# Patient Record
Sex: Female | Born: 1985 | Race: Black or African American | Hispanic: No | Marital: Single | Smoking: Former smoker
Health system: Southern US, Community
[De-identification: ages and names within clinical notes are randomized; demographics above are authoritative.]

## PROBLEM LIST (undated history)

## (undated) DIAGNOSIS — J45909 Unspecified asthma, uncomplicated: Secondary | ICD-10-CM

## (undated) DIAGNOSIS — F259 Schizoaffective disorder, unspecified: Secondary | ICD-10-CM

## (undated) DIAGNOSIS — N83209 Unspecified ovarian cyst, unspecified side: Secondary | ICD-10-CM

## (undated) DIAGNOSIS — F32A Depression, unspecified: Secondary | ICD-10-CM

## (undated) DIAGNOSIS — I10 Essential (primary) hypertension: Secondary | ICD-10-CM

## (undated) HISTORY — PX: OVARIAN CYST SURGERY: SHX726

---

## 2013-09-16 ENCOUNTER — Emergency Department (HOSPITAL_BASED_OUTPATIENT_CLINIC_OR_DEPARTMENT_OTHER)
Admission: EM | Admit: 2013-09-16 | Discharge: 2013-09-16 | Disposition: A | Discharge status: Home / Self Care | Attending: Emergency Medicine | Admitting: Emergency Medicine

## 2013-09-16 ENCOUNTER — Encounter (HOSPITAL_BASED_OUTPATIENT_CLINIC_OR_DEPARTMENT_OTHER): Payer: Self-pay | Admitting: Emergency Medicine

## 2013-09-16 DIAGNOSIS — R197 Diarrhea, unspecified: Secondary | ICD-10-CM

## 2013-09-16 DIAGNOSIS — Z3202 Encounter for pregnancy test, result negative: Secondary | ICD-10-CM | POA: Insufficient documentation

## 2013-09-16 DIAGNOSIS — Z79899 Other long term (current) drug therapy: Secondary | ICD-10-CM | POA: Insufficient documentation

## 2013-09-16 LAB — CBC WITH DIFFERENTIAL/PLATELET
Basophils Absolute: 0 10*3/uL (ref 0.0–0.1)
Basophils Relative: 0 % (ref 0–1)
EOS ABS: 0.2 10*3/uL (ref 0.0–0.7)
Eosinophils Relative: 4 % (ref 0–5)
HCT: 36 % (ref 36.0–46.0)
Hemoglobin: 11.6 g/dL — ABNORMAL LOW (ref 12.0–15.0)
Lymphocytes Relative: 30 % (ref 12–46)
Lymphs Abs: 1.4 10*3/uL (ref 0.7–4.0)
MCH: 26.2 pg (ref 26.0–34.0)
MCHC: 32.2 g/dL (ref 30.0–36.0)
MCV: 81.3 fL (ref 78.0–100.0)
Monocytes Absolute: 0.5 10*3/uL (ref 0.1–1.0)
Monocytes Relative: 11 % (ref 3–12)
NEUTROS ABS: 2.7 10*3/uL (ref 1.7–7.7)
NEUTROS PCT: 56 % (ref 43–77)
PLATELETS: 314 10*3/uL (ref 150–400)
RBC: 4.43 MIL/uL (ref 3.87–5.11)
RDW: 14.9 % (ref 11.5–15.5)
WBC: 4.8 10*3/uL (ref 4.0–10.5)

## 2013-09-16 LAB — COMPREHENSIVE METABOLIC PANEL
ALK PHOS: 78 U/L (ref 39–117)
ALT: 14 U/L (ref 0–35)
AST: 20 U/L (ref 0–37)
Albumin: 3.8 g/dL (ref 3.5–5.2)
Anion gap: 11 (ref 5–15)
BUN: 9 mg/dL (ref 6–23)
CHLORIDE: 103 meq/L (ref 96–112)
CO2: 27 meq/L (ref 19–32)
Calcium: 9.8 mg/dL (ref 8.4–10.5)
Creatinine, Ser: 0.9 mg/dL (ref 0.50–1.10)
GFR calc non Af Amer: 87 mL/min — ABNORMAL LOW (ref >90)
GLUCOSE: 92 mg/dL (ref 70–99)
POTASSIUM: 3.9 meq/L (ref 3.7–5.3)
SODIUM: 141 meq/L (ref 137–147)
TOTAL PROTEIN: 8 g/dL (ref 6.0–8.3)
Total Bilirubin: 0.5 mg/dL (ref 0.3–1.2)

## 2013-09-16 LAB — URINALYSIS, ROUTINE W REFLEX MICROSCOPIC
Bilirubin Urine: NEGATIVE
GLUCOSE, UA: NEGATIVE mg/dL
Hgb urine dipstick: NEGATIVE
Ketones, ur: 15 mg/dL — AB
NITRITE: NEGATIVE
Protein, ur: NEGATIVE mg/dL
SPECIFIC GRAVITY, URINE: 1.034 — AB (ref 1.005–1.030)
Urobilinogen, UA: 0.2 mg/dL (ref 0.0–1.0)
pH: 6 (ref 5.0–8.0)

## 2013-09-16 LAB — PREGNANCY, URINE: PREG TEST UR: NEGATIVE

## 2013-09-16 LAB — URINE MICROSCOPIC-ADD ON

## 2013-09-16 MED ORDER — KETOROLAC TROMETHAMINE 30 MG/ML IJ SOLN
30.0000 mg | Freq: Once | INTRAMUSCULAR | Status: AC
  Administered 2013-09-16: 30 mg via INTRAVENOUS
  Filled 2013-09-16: qty 1

## 2013-09-16 MED ORDER — SODIUM CHLORIDE 0.9 % IV BOLUS (SEPSIS)
1000.0000 mL | Freq: Once | INTRAVENOUS | Status: AC
  Administered 2013-09-16: 1000 mL via INTRAVENOUS

## 2013-09-16 NOTE — ED Provider Notes (Signed)
CSN: [id number]     Arrival date & time 09/16/13  0535 History   None    Chief Complaint  Patient presents with  . Diarrhea     (Consider location/radiation/quality/duration/timing/severity/associated sxs/prior Treatment) HPI Comments: Patient is a 28 year old female with no significant past medical history. She presents with complaints of diarrhea since yesterday morning. She reports multiple episodes of watery stools. There is no blood or fever. She reports some abdominal cramping but denies significant pain. She denies any ill contacts and denies having eaten any suspicious foods which may have made her sick.  Patient is a 29 y.o. female presenting with diarrhea. The history is provided by the patient.  Diarrhea Quality:  Watery Severity:  Moderate Onset quality:  Sudden Duration:  1 day Timing:  Constant Progression:  Worsening Relieved by:  Nothing Worsened by:  Nothing tried Ineffective treatments:  None tried Associated symptoms: no abdominal pain, no chills, no fever and no vomiting     History reviewed. No pertinent past medical history. History reviewed. No pertinent past surgical history. History reviewed. No pertinent family history. History  Substance Use Topics  . Smoking status: Never Smoker   . Smokeless tobacco: Not on file  . Alcohol Use: No     Comment: occasional   OB History   Grav Para Term Preterm Abortions TAB SAB Ect Mult Living                 Review of Systems  Constitutional: Negative for fever and chills.  Gastrointestinal: Positive for diarrhea. Negative for vomiting and abdominal pain.  All other systems reviewed and are negative.     Allergies  Review of patient's allergies indicates no known allergies.  Home Medications   Prior to Admission medications   Medication Sig Start Date End Date Taking? Authorizing Provider  prenatal vitamin w/FE, FA (PRENATAL 1 + 1) 27-1 MG TABS tablet Take 1 tablet by mouth daily at 12 noon.   Yes  Historical Provider, MD   BP 128/81  Pulse 84  Temp(Src) 98.4 F (36.9 C) (Oral)  Resp 18  Ht 5\' 9"  (1.753 m)  Wt 239 lb (108.41 kg)  BMI 35.28 kg/m2  SpO2 100%  LMP 08/12/2013 Physical Exam  Nursing note and vitals reviewed. Constitutional: She is oriented to person, place, and time. She appears well-developed and well-nourished. No distress.  HENT:  Head: Normocephalic and atraumatic.  Mouth/Throat: Oropharynx is clear and moist.  Neck: Normal range of motion. Neck supple.  Cardiovascular: Normal rate and regular rhythm.  Exam reveals no gallop and no friction rub.   No murmur heard. Pulmonary/Chest: Effort normal and breath sounds normal. No respiratory distress. She has no wheezes.  Abdominal: Soft. Bowel sounds are normal. She exhibits no distension. There is no tenderness.  Musculoskeletal: Normal range of motion. She exhibits no edema.  Neurological: She is alert and oriented to person, place, and time.  Skin: Skin is warm and dry. She is not diaphoretic.    ED Course  Procedures (including critical care time) Labs Review Labs Reviewed  URINALYSIS, ROUTINE W REFLEX MICROSCOPIC  PREGNANCY, URINE  CBC WITH DIFFERENTIAL  COMPREHENSIVE METABOLIC PANEL    Imaging Review No results found.   EKG Interpretation None      MDM   Final diagnoses:  None    Patient presents with a 24-hour history of diarrhea. This has all been nonbloody and her abdomen is benign. There is no elevation of white count and no electrolyte abnormalities that  would suggest dehydration. I suspect this is a viral illness, or perhaps less likely a food born illness. Her urinalysis is clear and pregnancy test is negative. She was given IV fluids and Toradol and is feeling better. She will be discharged to home with instructions to return as needed if she develops severe pain, bloody stool, or other new and concerning symptoms.   Geoffery Lyonsouglas Ariday Brinker, MD 09/16/13 [phone]

## 2013-09-16 NOTE — ED Notes (Signed)
Pt reports loose watery stools since 10 pm, lower abdominal pain since last pm, denies sick contacts, denies any recent antibiotics

## 2013-09-16 NOTE — Discharge Instructions (Signed)
Clear liquids as tolerated for the next 12 hours, then slowly advance your diet to normal.  Return to the emergency department if he develops severe abdominal pain, high fever, bloody stool, or other new and concerning symptoms.   Diarrhea Diarrhea is frequent loose and watery bowel movements. It can cause you to feel weak and dehydrated. Dehydration can cause you to become tired and thirsty, have a dry mouth, and have decreased urination that often is dark yellow. Diarrhea is a sign of another problem, most often an infection that will not last long. In most cases, diarrhea typically lasts 2-3 days. However, it can last longer if it is a sign of something more serious. It is important to treat your diarrhea as directed by your caregiver to lessen or prevent future episodes of diarrhea. CAUSES  Some common causes include:  Gastrointestinal infections caused by viruses, bacteria, or parasites.  Food poisoning or food allergies.  Certain medicines, such as antibiotics, chemotherapy, and laxatives.  Artificial sweeteners and fructose.  Digestive disorders. HOME CARE INSTRUCTIONS  Ensure adequate fluid intake (hydration): Have 1 cup (8 oz) of fluid for each diarrhea episode. Avoid fluids that contain simple sugars or sports drinks, fruit juices, whole milk products, and sodas. Your urine should be clear or pale yellow if you are drinking enough fluids. Hydrate with an oral rehydration solution that you can purchase at pharmacies, retail stores, and online. You can prepare an oral rehydration solution at home by mixing the following ingredients together:   - tsp table salt.   tsp baking soda.   tsp salt substitute containing potassium chloride.  1  tablespoons sugar.  1 L (34 oz) of water.  Certain foods and beverages may increase the speed at which food moves through the gastrointestinal (GI) tract. These foods and beverages should be avoided and include:  Caffeinated and alcoholic  beverages.  High-fiber foods, such as raw fruits and vegetables, nuts, seeds, and whole grain breads and cereals.  Foods and beverages sweetened with sugar alcohols, such as xylitol, sorbitol, and mannitol.  Some foods may be well tolerated and may help thicken stool including:  Starchy foods, such as rice, toast, pasta, low-sugar cereal, oatmeal, grits, baked potatoes, crackers, and bagels.  Bananas.  Applesauce.  Add probiotic-rich foods to help increase healthy bacteria in the GI tract, such as yogurt and fermented milk products.  Wash your hands well after each diarrhea episode.  Only take over-the-counter or prescription medicines as directed by your caregiver.  Take a warm bath to relieve any burning or pain from frequent diarrhea episodes. SEEK IMMEDIATE MEDICAL CARE IF:   You are unable to keep fluids down.  You have persistent vomiting.  You have blood in your stool, or your stools are black and tarry.  You do not urinate in 6-8 hours, or there is only a small amount of very dark urine.  You have abdominal pain that increases or localizes.  You have weakness, dizziness, confusion, or light-headedness.  You have a severe headache.  Your diarrhea gets worse or does not get better.  You have a fever or persistent symptoms for more than 2-3 days.  You have a fever and your symptoms suddenly get worse. MAKE SURE YOU:   Understand these instructions.  Will watch your condition.  Will get help right away if you are not doing well or get worse. Document Released: 01/24/2002 Document Revised: 06/20/2013 Document Reviewed: 10/12/2011 [facility] Patient Information 2015 [place], [place]. This information is not intended to  replace advice given to you by your health care provider. Make sure you discuss any questions you have with your health care provider. ° °

## 2014-04-22 DIAGNOSIS — R141 Gas pain: Secondary | ICD-10-CM | POA: Insufficient documentation

## 2014-04-22 DIAGNOSIS — Z720 Tobacco use: Secondary | ICD-10-CM | POA: Diagnosis not present

## 2014-04-22 DIAGNOSIS — K5909 Other constipation: Secondary | ICD-10-CM | POA: Insufficient documentation

## 2014-04-22 DIAGNOSIS — Z8742 Personal history of other diseases of the female genital tract: Secondary | ICD-10-CM | POA: Diagnosis not present

## 2014-04-22 DIAGNOSIS — Z3202 Encounter for pregnancy test, result negative: Secondary | ICD-10-CM | POA: Diagnosis not present

## 2014-04-22 DIAGNOSIS — J029 Acute pharyngitis, unspecified: Secondary | ICD-10-CM | POA: Diagnosis present

## 2014-04-22 DIAGNOSIS — J069 Acute upper respiratory infection, unspecified: Secondary | ICD-10-CM | POA: Diagnosis not present

## 2014-04-22 DIAGNOSIS — J45909 Unspecified asthma, uncomplicated: Secondary | ICD-10-CM | POA: Insufficient documentation

## 2014-04-22 NOTE — ED Notes (Addendum)
Pt reports 5 days of sinus congestion and sore throat, reports multiple sick contacts, one with strep and another with pneumonia.  Subjective fever.  Also reports missed period, last one jan 8, having abd pain and gas.  Reports attempt to get pregnant currently.  Denies n/v/d.  Reported fatigue. Throat slightly red, no tonsillar swelling or exudate noted.

## 2014-04-23 ENCOUNTER — Encounter (HOSPITAL_BASED_OUTPATIENT_CLINIC_OR_DEPARTMENT_OTHER): Payer: Self-pay

## 2014-04-23 ENCOUNTER — Emergency Department (HOSPITAL_BASED_OUTPATIENT_CLINIC_OR_DEPARTMENT_OTHER)
Admission: EM | Admit: 2014-04-23 | Discharge: 2014-04-23 | Disposition: A | Discharge status: Home / Self Care | Attending: Emergency Medicine | Admitting: Emergency Medicine

## 2014-04-23 DIAGNOSIS — IMO0001 Reserved for inherently not codable concepts without codable children: Secondary | ICD-10-CM

## 2014-04-23 DIAGNOSIS — K5909 Other constipation: Secondary | ICD-10-CM

## 2014-04-23 DIAGNOSIS — J069 Acute upper respiratory infection, unspecified: Secondary | ICD-10-CM

## 2014-04-23 HISTORY — DX: Unspecified asthma, uncomplicated: J45.909

## 2014-04-23 HISTORY — DX: Unspecified ovarian cyst, unspecified side: N83.209

## 2014-04-23 LAB — URINALYSIS, ROUTINE W REFLEX MICROSCOPIC
Bilirubin Urine: NEGATIVE
Glucose, UA: NEGATIVE mg/dL
Hgb urine dipstick: NEGATIVE
KETONES UR: NEGATIVE mg/dL
Leukocytes, UA: NEGATIVE
Nitrite: NEGATIVE
PH: 7 (ref 5.0–8.0)
PROTEIN: NEGATIVE mg/dL
SPECIFIC GRAVITY, URINE: 1.011 (ref 1.005–1.030)
UROBILINOGEN UA: 0.2 mg/dL (ref 0.0–1.0)

## 2014-04-23 LAB — PREGNANCY, URINE: PREG TEST UR: NEGATIVE

## 2014-04-23 MED ORDER — IBUPROFEN 400 MG PO TABS: 400.0000 mg | ORAL_TABLET | Freq: Four times a day (QID) | ORAL | Status: DC | PRN

## 2014-04-23 MED ORDER — IBUPROFEN 800 MG PO TABS
800.0000 mg | ORAL_TABLET | Freq: Once | ORAL | Status: AC
  Administered 2014-04-23: 800 mg via ORAL
  Filled 2014-04-23: qty 1

## 2014-04-23 NOTE — Discharge Instructions (Signed)
°Bloating °Bloating is the feeling of fullness in your belly. You may feel as though your pants are too tight. Often the cause of bloating is overeating, retaining fluids, or having gas in your bowel. It is also caused by swallowing air and eating foods that cause gas. Irritable bowel syndrome is one of the most common causes of bloating. Constipation is also a common cause. Sometimes more serious problems can cause bloating. °SYMPTOMS  °Usually there is a feeling of fullness, as though your abdomen is bulged out. There may be mild discomfort.  °DIAGNOSIS  °Usually no particular testing is necessary for most bloating. If the condition persists and seems to become worse, your caregiver may do additional testing.  °TREATMENT  °· There is no direct treatment for bloating. °· Do not put gas into the bowel. Avoid chewing gum and sucking on candy. These tend to make you swallow air. Swallowing air can also be a nervous habit. Try to avoid this. °· Avoiding high residue diets will help. Eat foods with soluble fibers (examples include root vegetables, apples, or barley) and substitute dairy products with soy and rice products. This helps irritable bowel syndrome. °· If constipation is the cause, then a high residue diet with more fiber will help. °· Avoid carbonated beverages. °· Over-the-counter preparations are available that help reduce gas. Your pharmacist can help you with this. °SEEK MEDICAL CARE IF:  °· Bloating continues and seems to be getting worse. °· You notice a weight gain. °· You have a weight loss but the bloating is getting worse. °· You have changes in your bowel habits or develop nausea or vomiting. °SEEK IMMEDIATE MEDICAL CARE IF:  °· You develop shortness of breath or swelling in your legs. °· You have an increase in abdominal pain or develop chest pain. °Document Released: 12/04/2005 Document Revised: 04/28/2011 Document Reviewed: 01/22/2007 °ExitCare® Patient Information ©2015 [employer]. This  information is not intended to replace advice given to you by your health care provider. Make sure you discuss any questions you have with your health care provider. ° °Constipation °Constipation is when a person: °· Poops (has a bowel movement) less than 3 times a week. °· Has a hard time pooping. °· Has poop that is dry, hard, or bigger than normal. °HOME CARE  °· Eat foods with a lot of fiber in them. This includes fruits, vegetables, beans, and whole grains such as brown rice. °· Avoid fatty foods and foods with a lot of sugar. This includes french fries, hamburgers, cookies, candy, and soda. °· If you are not getting enough fiber from food, take products with added fiber in them (supplements). °· Drink enough fluid to keep your pee (urine) clear or pale yellow. °· Exercise on a regular basis, or as told by your doctor. °· Go to the restroom when you feel like you need to poop. Do not hold it. °· Only take medicine as told by your doctor. Do not take medicines that help you poop (laxatives) without talking to your doctor first. °GET HELP RIGHT AWAY IF:  °· You have bright red blood in your poop (stool). °· Your constipation lasts more than 4 days or gets worse. °· You have belly (abdominal) or butt (rectal) pain. °· You have thin poop (as thin as a pencil). °· You lose weight, and it cannot be explained. °MAKE SURE YOU:  °· Understand these instructions. °· Will watch your condition. °· Will get help right away if you are not doing well or   get worse. Document Released: 07/23/2007 Document Revised: 02/08/2013 Document Reviewed: 11/15/2012 [facility] Patient Information 2015 [place]Care, [place]. This information is not intended to replace advice given to you by your health care provider. Make sure you discuss any questions you have with your health care provider.  Cool Mist Vaporizers Vaporizers may help relieve the symptoms of a cough and cold. They add moisture to the air, which helps mucus to become thinner and  less sticky. This makes it easier to breathe and cough up secretions. Cool mist vaporizers do not cause serious burns like hot mist vaporizers, which may also be called steamers or humidifiers. Vaporizers have not been proven to help with colds. You should not use a vaporizer if you are allergic to mold. HOME CARE INSTRUCTIONS  Follow the package instructions for the vaporizer.  Do not use anything other than distilled water in the vaporizer.  Do not run the vaporizer all of the time. This can cause mold or bacteria to grow in the vaporizer.  Clean the vaporizer after each time it is used.  Clean and dry the vaporizer well before storing it.  Stop using the vaporizer if worsening respiratory symptoms develop. Document Released: 11/01/2003 Document Revised: 02/08/2013 Document Reviewed: 06/23/2012 [facility] Patient Information 2015 [place] CanyonExitCare, [place]LLC. This information is not intended to replace advice given to you by your health care provider. Make sure you discuss any questions you have with your health care provider.

## 2014-04-23 NOTE — ED Provider Notes (Signed)
CSN: [id number]     Arrival date & time 04/22/14  2336 History   First MD Initiated Contact with Patient 04/23/14 0203     Chief Complaint  Patient presents with  . Sore Throat     (Consider location/radiation/quality/duration/timing/severity/associated sxs/prior Treatment) Patient is a 29 y.o. female presenting with pharyngitis. The history is provided by the patient.  Sore Throat This is a new problem. The current episode started more than 2 days ago. The problem occurs constantly. The problem has not changed since onset.Pertinent negatives include no chest pain, no headaches and no shortness of breath. Associated symptoms comments: Gas pressure in abdomen and missed period. Nothing aggravates the symptoms. Nothing relieves the symptoms. She has tried nothing for the symptoms. The treatment provided no relief.    Past Medical History  Diagnosis Date  . Asthma   . Ovarian cyst    Past Surgical History  Procedure Laterality Date  . Ovarian cyst surgery    . Cesarean section     No family history on file. History  Substance Use Topics  . Smoking status: Light Tobacco Smoker    Types: Cigarettes  . Smokeless tobacco: Not on file  . Alcohol Use: No     Comment: occasional   OB History    No data available     Review of Systems  Constitutional: Negative for fever.  HENT: Positive for congestion and sore throat. Negative for trouble swallowing and voice change.   Respiratory: Negative for shortness of breath.   Cardiovascular: Negative for chest pain.  Gastrointestinal: Negative for vomiting.  Neurological: Negative for headaches.  All other systems reviewed and are negative.     Allergies  Review of patient's allergies indicates no known allergies.  Home Medications   Prior to Admission medications   Medication Sig Start Date End Date Taking? Authorizing Provider  prenatal vitamin w/FE, FA (PRENATAL 1 + 1) 27-1 MG TABS tablet Take 1 tablet by mouth daily at 12  noon.    Historical Provider, MD   BP 141/85 mmHg  Pulse 94  Temp(Src) 98.2 F (36.8 C) (Oral)  Resp 18  Ht 5\' 9"  (1.753 m)  Wt 250 lb (113.399 kg)  BMI 36.90 kg/m2  SpO2 100%  LMP 02/24/2014 Physical Exam  Constitutional: She is oriented to person, place, and time. She appears well-developed and well-nourished. No distress.  HENT:  Head: Normocephalic and atraumatic.  Mouth/Throat: Oropharynx is clear and moist. No oropharyngeal exudate.  Eyes: Conjunctivae are normal. Pupils are equal, round, and reactive to light.  Neck: Normal range of motion. Neck supple. No tracheal deviation present.  Intact phonation no pain with displacement of the trachea  Cardiovascular: Normal rate and intact distal pulses.   Pulmonary/Chest: Effort normal and breath sounds normal. She has no wheezes. She has no rales.  Abdominal: Soft. Bowel sounds are normal. She exhibits no mass. There is no tenderness. There is no rebound and no guarding.  Hyperactive BS and palpable stool throughout  Musculoskeletal: Normal range of motion.  Lymphadenopathy:    She has no cervical adenopathy.  Neurological: She is alert and oriented to person, place, and time.  Skin: Skin is warm and dry.  Psychiatric: She has a normal mood and affect.    ED Course  Procedures (including critical care time) Labs Review Labs Reviewed  URINALYSIS, ROUTINE W REFLEX MICROSCOPIC  PREGNANCY, URINE    Imaging Review No results found.   EKG Interpretation None      MDM  Final diagnoses:  None   Based on [facility] criteria there is no need for further testing or treatment  URI and gas and constipation.  Was here mainly because she thought she is pregnant having missed a period.  But she has not.  Will treat with miralax gas x and ibuprofen    Deondrea Markos K Chimamanda Siegfried-Rasch, MD 04/23/14 [phone]

## 2014-12-13 ENCOUNTER — Encounter (HOSPITAL_BASED_OUTPATIENT_CLINIC_OR_DEPARTMENT_OTHER): Payer: Self-pay

## 2014-12-13 ENCOUNTER — Emergency Department (HOSPITAL_BASED_OUTPATIENT_CLINIC_OR_DEPARTMENT_OTHER)
Admission: EM | Admit: 2014-12-13 | Discharge: 2014-12-13 | Disposition: A | Discharge status: Home / Self Care | Attending: Emergency Medicine | Admitting: Emergency Medicine

## 2014-12-13 DIAGNOSIS — Z87891 Personal history of nicotine dependence: Secondary | ICD-10-CM | POA: Diagnosis not present

## 2014-12-13 DIAGNOSIS — Z8742 Personal history of other diseases of the female genital tract: Secondary | ICD-10-CM | POA: Diagnosis not present

## 2014-12-13 DIAGNOSIS — Z3202 Encounter for pregnancy test, result negative: Secondary | ICD-10-CM | POA: Diagnosis not present

## 2014-12-13 DIAGNOSIS — J020 Streptococcal pharyngitis: Secondary | ICD-10-CM | POA: Diagnosis not present

## 2014-12-13 DIAGNOSIS — J45909 Unspecified asthma, uncomplicated: Secondary | ICD-10-CM | POA: Diagnosis not present

## 2014-12-13 DIAGNOSIS — R509 Fever, unspecified: Secondary | ICD-10-CM | POA: Diagnosis present

## 2014-12-13 LAB — URINALYSIS, ROUTINE W REFLEX MICROSCOPIC
Bilirubin Urine: NEGATIVE
Glucose, UA: NEGATIVE mg/dL
HGB URINE DIPSTICK: NEGATIVE
Ketones, ur: NEGATIVE mg/dL
Leukocytes, UA: NEGATIVE
Nitrite: NEGATIVE
PH: 7 (ref 5.0–8.0)
Protein, ur: NEGATIVE mg/dL
SPECIFIC GRAVITY, URINE: 1.007 (ref 1.005–1.030)
UROBILINOGEN UA: 1 mg/dL (ref 0.0–1.0)

## 2014-12-13 LAB — PREGNANCY, URINE: PREG TEST UR: NEGATIVE

## 2014-12-13 LAB — RAPID STREP SCREEN (MED CTR MEBANE ONLY): Streptococcus, Group A Screen (Direct): POSITIVE — AB

## 2014-12-13 MED ORDER — IBUPROFEN 100 MG/5ML PO SUSP: 10.0000 mg/kg | Freq: Four times a day (QID) | ORAL | Status: DC | PRN

## 2014-12-13 MED ORDER — ACETAMINOPHEN 160 MG/5ML PO ELIX: 15.0000 mg/kg | ORAL_SOLUTION | Freq: Four times a day (QID) | ORAL | Status: DC | PRN

## 2014-12-13 MED ORDER — ACETAMINOPHEN 325 MG PO TABS
650.0000 mg | ORAL_TABLET | Freq: Once | ORAL | Status: AC
  Administered 2014-12-13: 650 mg via ORAL
  Filled 2014-12-13: qty 2

## 2014-12-13 MED ORDER — PENICILLIN G BENZATHINE 1200000 UNIT/2ML IM SUSP
1.2000 10*6.[IU] | Freq: Once | INTRAMUSCULAR | Status: AC
  Administered 2014-12-13: 1.2 10*6.[IU] via INTRAMUSCULAR
  Filled 2014-12-13: qty 2

## 2014-12-13 MED ORDER — KETOROLAC TROMETHAMINE 60 MG/2ML IM SOLN
60.0000 mg | Freq: Once | INTRAMUSCULAR | Status: AC
  Administered 2014-12-13: 60 mg via INTRAMUSCULAR
  Filled 2014-12-13: qty 2

## 2014-12-13 NOTE — ED Provider Notes (Signed)
CSN: [id number]     Arrival date & time 12/13/14  1808 History   First MD Initiated Contact with Patient 12/13/14 1824     Chief Complaint  Patient presents with  . Fever     (Consider location/radiation/quality/duration/timing/severity/associated sxs/prior Treatment) HPI Patient developed a sore throat 5 days ago. She reports it is painful to swallow and her breath has been really bad. She states she's had just a little bit of nasal discharge. No cough. She for she has asthma but doesn't feel like that has been acting up. She reports she's had some nausea but no active vomiting. Poor she's had a fever which got up to 102. She reports she's betting getting some chills for she is able to swallow her saliva and liquids. Past Medical History  Diagnosis Date  . Asthma   . Ovarian cyst    Past Surgical History  Procedure Laterality Date  . Ovarian cyst surgery    . Cesarean section     No family history on file. Social History  Substance Use Topics  . Smoking status: Former Smoker    Types: Cigarettes  . Smokeless tobacco: None  . Alcohol Use: Yes     Comment: occ   OB History    No data available     Review of Systems 10 Systems reviewed and are negative for acute change except as noted in the HPI.    Allergies  Review of patient's allergies indicates no known allergies.  Home Medications   Prior to Admission medications   Medication Sig Start Date End Date Taking? Authorizing Provider  acetaminophen (TYLENOL) 160 MG/5ML elixir Take 31 mLs (992 mg total) by mouth every 6 (six) hours as needed for fever or pain. 12/13/14   Arby Barrette, MD  ibuprofen (CHILD IBUPROFEN) 100 MG/5ML suspension Take 33.1 mLs (662 mg total) by mouth every 6 (six) hours as needed. 12/13/14   Arby Barrette, MD   BP 156/98 mmHg  Pulse 102  Temp(Src) 102.9 F (39.4 C) (Oral)  Resp 16  Ht  (1.753 m)  Wt 250 lb (113.399 kg)  BMI 36.90 kg/m2  SpO2 97%  LMP 10/29/2014 Physical Exam   Constitutional: She is oriented to person, place, and time. She appears well-developed and well-nourished.  HENT:  Head: Normocephalic and atraumatic.  Vital TMs are without erythema or bulging. Posterior oropharynx has thick white exudate on both tonsils. Oropharynx is widely patent. No difficulty handling handling secretions.  Eyes: EOM are normal. Pupils are equal, round, and reactive to light.  Neck: Neck supple.  Cardiovascular: Normal rate, regular rhythm, normal heart sounds and intact distal pulses.   Pulmonary/Chest: Effort normal and breath sounds normal.  Abdominal: Soft. Bowel sounds are normal. She exhibits no distension. There is no tenderness.  Musculoskeletal: Normal range of motion. She exhibits no edema.  Neurological: She is alert and oriented to person, place, and time. She has normal strength. Coordination normal. GCS eye subscore is 4. GCS verbal subscore is 5. GCS motor subscore is 6.  Skin: Skin is warm, dry and intact.  Psychiatric: She has a normal mood and affect.    ED Course  Procedures (including critical care time) Labs Review Labs Reviewed  RAPID STREP SCREEN (NOT AT [facility]) - Abnormal; Notable for the following:    Streptococcus, Group A Screen (Direct) POSITIVE (*)    All other components within normal limits  URINALYSIS, ROUTINE W REFLEX MICROSCOPIC (NOT AT [facility])  PREGNANCY, URINE    Imaging Review  No results found. I have personally reviewed and evaluated these images and lab results as part of my medical decision-making.   EKG Interpretation None      MDM   Final diagnoses:  Strep pharyngitis   A she is alert and nontoxic. She is up and ambulatory in the room. She is not having difficulty held handling secretions or breathing. She'll be given Bicillin in the emergency department and prescriptions for liquid ibuprofen and Tylenol for fever or pain.    Arby BarretteMarcy Randell Teare, MD 12/13/14 [phone]

## 2014-12-13 NOTE — ED Notes (Signed)
MD at bedside. 

## 2014-12-13 NOTE — Discharge Instructions (Signed)

## 2014-12-13 NOTE — ED Notes (Signed)
Fever with swelling to right side of throat x 5 days

## 2015-01-24 ENCOUNTER — Encounter (HOSPITAL_BASED_OUTPATIENT_CLINIC_OR_DEPARTMENT_OTHER): Payer: Self-pay

## 2015-01-24 ENCOUNTER — Emergency Department (HOSPITAL_BASED_OUTPATIENT_CLINIC_OR_DEPARTMENT_OTHER)
Admission: EM | Admit: 2015-01-24 | Discharge: 2015-01-24 | Disposition: A | Discharge status: Home / Self Care | Attending: Emergency Medicine | Admitting: Emergency Medicine

## 2015-01-24 ENCOUNTER — Emergency Department (HOSPITAL_BASED_OUTPATIENT_CLINIC_OR_DEPARTMENT_OTHER)

## 2015-01-24 DIAGNOSIS — Z87891 Personal history of nicotine dependence: Secondary | ICD-10-CM | POA: Diagnosis not present

## 2015-01-24 DIAGNOSIS — J45909 Unspecified asthma, uncomplicated: Secondary | ICD-10-CM | POA: Insufficient documentation

## 2015-01-24 DIAGNOSIS — M549 Dorsalgia, unspecified: Secondary | ICD-10-CM | POA: Diagnosis not present

## 2015-01-24 DIAGNOSIS — N898 Other specified noninflammatory disorders of vagina: Secondary | ICD-10-CM | POA: Diagnosis present

## 2015-01-24 DIAGNOSIS — N760 Acute vaginitis: Secondary | ICD-10-CM | POA: Insufficient documentation

## 2015-01-24 DIAGNOSIS — L739 Follicular disorder, unspecified: Secondary | ICD-10-CM | POA: Diagnosis not present

## 2015-01-24 DIAGNOSIS — Z3202 Encounter for pregnancy test, result negative: Secondary | ICD-10-CM | POA: Insufficient documentation

## 2015-01-24 DIAGNOSIS — M7989 Other specified soft tissue disorders: Secondary | ICD-10-CM | POA: Diagnosis not present

## 2015-01-24 DIAGNOSIS — R079 Chest pain, unspecified: Secondary | ICD-10-CM | POA: Diagnosis not present

## 2015-01-24 DIAGNOSIS — R21 Rash and other nonspecific skin eruption: Secondary | ICD-10-CM | POA: Diagnosis not present

## 2015-01-24 DIAGNOSIS — R110 Nausea: Secondary | ICD-10-CM | POA: Diagnosis not present

## 2015-01-24 DIAGNOSIS — B9689 Other specified bacterial agents as the cause of diseases classified elsewhere: Secondary | ICD-10-CM

## 2015-01-24 LAB — BASIC METABOLIC PANEL
Anion gap: 7 (ref 5–15)
BUN: 10 mg/dL (ref 6–20)
CALCIUM: 9 mg/dL (ref 8.9–10.3)
CO2: 26 mmol/L (ref 22–32)
Chloride: 106 mmol/L (ref 101–111)
Creatinine, Ser: 0.8 mg/dL (ref 0.44–1.00)
GFR calc Af Amer: >60 mL/min (ref >60)
GFR calc non Af Amer: >60 mL/min (ref >60)
GLUCOSE: 84 mg/dL (ref 65–99)
Potassium: 3.6 mmol/L (ref 3.5–5.1)
SODIUM: 139 mmol/L (ref 135–145)

## 2015-01-24 LAB — WET PREP, GENITAL
SPERM: NONE SEEN
Trich, Wet Prep: NONE SEEN
YEAST WET PREP: NONE SEEN

## 2015-01-24 LAB — CBC WITH DIFFERENTIAL/PLATELET
BASOS PCT: 0 %
Basophils Absolute: 0 10*3/uL (ref 0.0–0.1)
Eosinophils Absolute: 0.2 10*3/uL (ref 0.0–0.7)
Eosinophils Relative: 3 %
HEMATOCRIT: 37.9 % (ref 36.0–46.0)
Hemoglobin: 12.1 g/dL (ref 12.0–15.0)
LYMPHS PCT: 50 %
Lymphs Abs: 2.8 10*3/uL (ref 0.7–4.0)
MCH: 25.8 pg — AB (ref 26.0–34.0)
MCHC: 31.9 g/dL (ref 30.0–36.0)
MCV: 80.8 fL (ref 78.0–100.0)
MONO ABS: 0.4 10*3/uL (ref 0.1–1.0)
Monocytes Relative: 7 %
NEUTROS ABS: 2.2 10*3/uL (ref 1.7–7.7)
Neutrophils Relative %: 40 %
Platelets: 352 10*3/uL (ref 150–400)
RBC: 4.69 MIL/uL (ref 3.87–5.11)
RDW: 14.9 % (ref 11.5–15.5)
WBC: 5.6 10*3/uL (ref 4.0–10.5)

## 2015-01-24 LAB — URINALYSIS, ROUTINE W REFLEX MICROSCOPIC
BILIRUBIN URINE: NEGATIVE
GLUCOSE, UA: NEGATIVE mg/dL
HGB URINE DIPSTICK: NEGATIVE
KETONES UR: 15 mg/dL — AB
Leukocytes, UA: NEGATIVE
Nitrite: NEGATIVE
PH: 7 (ref 5.0–8.0)
Protein, ur: NEGATIVE mg/dL
SPECIFIC GRAVITY, URINE: 1.028 (ref 1.005–1.030)

## 2015-01-24 LAB — PREGNANCY, URINE: Preg Test, Ur: NEGATIVE

## 2015-01-24 MED ORDER — METRONIDAZOLE 500 MG PO TABS: 500.0000 mg | ORAL_TABLET | Freq: Two times a day (BID) | ORAL | Status: DC

## 2015-01-24 NOTE — ED Notes (Signed)
C/o rash to groin x 2 weeks-vaginal d/c x 2 weeks-dx BV but did not take rx

## 2015-01-24 NOTE — ED Provider Notes (Signed)
CSN: [id number]     Arrival date & time 01/24/15  1740 History   By signing my name below, I, Arlan Organ, attest that this documentation has been prepared under the direction and in the presence of Vanetta Mulders, MD.  Electronically Signed: Arlan Organ, ED Scribe. 01/24/2015. 7:17 PM.   Chief Complaint  Patient presents with  . Vaginal Discharge   The history is provided by the patient. No language interpreter was used.    HPI Comments: Donna Bowers is a 29 y.o. female without any pertinent past medical history who presents to the Emergency Department complaining of ongoing, worsening painful rash to the vaginal area x 2 weeks. Pt states she recently started a new hair removal regimen and noted a small "bump" that gradually worsened overtime. No aggravating or alleviating factors at this time. Pt also reports a subjective fever, chills, vaginal discharge, dysuria, and cough. No OTC medications or home remedies attempted prior to arrival. She denies any fever,vomiting, diarrhea, or abdominal pain. LNMP 11/26. Ms. Balan was recently evaluated by her PCP and was diagnosed with BV. Pt was prescribed an ointment to use but never started the medication as she wanted an oral medication instead. However, pt states she could not remember the name of the oral medication and never called her doctor for further options.  PCP: Pcp Not In System    Past Medical History  Diagnosis Date  . Asthma   . Ovarian cyst    Past Surgical History  Procedure Laterality Date  . Ovarian cyst surgery    . Cesarean section     No family history on file. Social History  Substance Use Topics  . Smoking status: Former Smoker    Types: Cigarettes  . Smokeless tobacco: None  . Alcohol Use: Yes     Comment: occ   OB History    No data available     Review of Systems  Constitutional: Positive for fever and chills.  HENT: Negative for rhinorrhea and sore throat.   Eyes: Negative for visual disturbance.   Respiratory: Positive for cough. Negative for shortness of breath.   Cardiovascular: Positive for chest pain and leg swelling.  Gastrointestinal: Positive for nausea. Negative for vomiting, abdominal pain and diarrhea.  Genitourinary: Positive for dysuria and vaginal discharge.  Musculoskeletal: Positive for back pain. Negative for neck pain.  Skin: Positive for rash.  Neurological: Negative for dizziness, light-headedness and headaches.  Hematological: Does not bruise/bleed easily.  Psychiatric/Behavioral: Negative for confusion.      Allergies  Review of patient's allergies indicates no known allergies.  Home Medications   Prior to Admission medications   Medication Sig Start Date End Date Taking? Authorizing Provider  metroNIDAZOLE (FLAGYL) 500 MG tablet Take 1 tablet (500 mg total) by mouth 2 (two) times daily. 01/24/15   Vanetta Mulders, MD   Triage Vitals: BP 138/102 mmHg  Pulse 75  Temp(Src) 98.7 F (37.1 C) (Oral)  Resp 16  Ht  (1.753 m)  Wt 104.327 kg  BMI 33.95 kg/m2  SpO2 100%  LMP 01/13/2015   Physical Exam  Constitutional: She is oriented to person, place, and time. She appears well-developed and well-nourished. No distress.  HENT:  Head: Normocephalic and atraumatic.  Mouth/Throat: Oropharynx is clear and moist.  Eyes: Conjunctivae and EOM are normal. Pupils are equal, round, and reactive to light.  Sclera normal Eyes track normally   Neck: Normal range of motion.  Cardiovascular: Normal rate, regular rhythm and normal heart sounds.  Pulmonary/Chest: Effort normal and breath sounds normal. She has no wheezes.  Abdominal: Soft. Bowel sounds are normal. She exhibits no distension. There is no tenderness.  Genitourinary:  Normal external genitalia. A little bit of some mild folliculitis. No significant lesions. No cervical motion tenderness. Slight vaginal discharge whitish in color. No uterine or adnexal tenderness.  Musculoskeletal: Normal range of  motion. She exhibits no edema.  Neurological: She is alert and oriented to person, place, and time. No cranial nerve deficit. She exhibits normal muscle tone. Coordination normal.  Skin: Skin is warm and dry.  Psychiatric: She has a normal mood and affect. Judgment normal.  Nursing note and vitals reviewed.   ED Course  Procedures (including critical care time)  DIAGNOSTIC STUDIES: Oxygen Saturation is 98% on RA, Normal by my interpretation.    COORDINATION OF CARE: 7:05 PM- Will order urine pregnancy and urinalysis. Discussed treatment plan with pt at bedside and pt agreed to plan.     Labs Review Labs Reviewed  WET PREP, GENITAL - Abnormal; Notable for the following:    Clue Cells Wet Prep HPF POC PRESENT (*)    WBC, Wet Prep HPF POC FEW (*)    All other components within normal limits  URINALYSIS, ROUTINE W REFLEX MICROSCOPIC (NOT AT [facility]) - Abnormal; Notable for the following:    Ketones, ur 15 (*)    All other components within normal limits  CBC WITH DIFFERENTIAL/PLATELET - Abnormal; Notable for the following:    MCH 25.8 (*)    All other components within normal limits  PREGNANCY, URINE  BASIC METABOLIC PANEL  HIV ANTIBODY (ROUTINE TESTING)  GC/CHLAMYDIA PROBE AMP ([place]) NOT AT [facility]   Results for orders placed or performed during the hospital encounter of 01/24/15  Wet prep, genital  Result Value Ref Range   Yeast Wet Prep HPF POC NONE SEEN NONE SEEN   Trich, Wet Prep NONE SEEN NONE SEEN   Clue Cells Wet Prep HPF POC PRESENT (A) NONE SEEN   WBC, Wet Prep HPF POC FEW (A) NONE SEEN   Sperm NONE SEEN   Urinalysis, Routine w reflex microscopic (not at [facility])  Result Value Ref Range   Color, Urine YELLOW YELLOW   APPearance CLEAR CLEAR   Specific Gravity, Urine 1.028 1.005 - 1.030   pH 7.0 5.0 - 8.0   Glucose, UA NEGATIVE NEGATIVE mg/dL   Hgb urine dipstick NEGATIVE NEGATIVE   Bilirubin Urine NEGATIVE NEGATIVE   Ketones, ur 15 (A) NEGATIVE mg/dL   Protein,  ur NEGATIVE NEGATIVE mg/dL   Nitrite NEGATIVE NEGATIVE   Leukocytes, UA NEGATIVE NEGATIVE  Pregnancy, urine  Result Value Ref Range   Preg Test, Ur NEGATIVE NEGATIVE  Basic metabolic panel  Result Value Ref Range   Sodium 139 135 - 145 mmol/L   Potassium 3.6 3.5 - 5.1 mmol/L   Chloride 106 101 - 111 mmol/L   CO2 26 22 - 32 mmol/L   Glucose, Bld 84 65 - 99 mg/dL   BUN 10 6 - 20 mg/dL   Creatinine, Ser 6.29 0.44 - 1.00 mg/dL   Calcium 9.0 8.9 - 52.8 mg/dL   GFR calc non Af Amer >60 >60 mL/min   GFR calc Af Amer >60 >60 mL/min   Anion gap 7 5 - 15  CBC with Differential/Platelet  Result Value Ref Range   WBC 5.6 4.0 - 10.5 K/uL   RBC 4.69 3.87 - 5.11 MIL/uL   Hemoglobin 12.1 12.0 - 15.0 g/dL  HCT 37.9 36.0 - 46.0 %   MCV 80.8 78.0 - 100.0 fL   MCH 25.8 (L) 26.0 - 34.0 pg   MCHC 31.9 30.0 - 36.0 g/dL   RDW 40.914.9 81.111.5 - 91.415.5 %   Platelets 352 150 - 400 K/uL   Neutrophils Relative % 40 %   Neutro Abs 2.2 1.7 - 7.7 K/uL   Lymphocytes Relative 50 %   Lymphs Abs 2.8 0.7 - 4.0 K/uL   Monocytes Relative 7 %   Monocytes Absolute 0.4 0.1 - 1.0 K/uL   Eosinophils Relative 3 %   Eosinophils Absolute 0.2 0.0 - 0.7 K/uL   Basophils Relative 0 %   Basophils Absolute 0.0 0.0 - 0.1 K/uL     Imaging Review Dg Chest 2 View  01/24/2015  CLINICAL DATA:  Cough, fever, chest pain, and lower extremity swelling. EXAM: CHEST  2 VIEW COMPARISON:  None. FINDINGS: The heart size and mediastinal contours are within normal limits. Both lungs are clear. The visualized skeletal structures are unremarkable. IMPRESSION: No active cardiopulmonary disease. Electronically Signed   By: Myles RosenthalJohn  Stahl M.D.   On: 01/24/2015 19:32   I have personally reviewed and evaluated these images and lab results as part of my medical decision-making.   EKG Interpretation None      MDM   Final diagnoses:  Bacterial vaginosis    Workup without any significant findings. For the cough chest x-ray was negative. Labs  without any sniffing and mallet. No significant external genitalia rash other than a little bit of mild folliculitis. Slight vaginal discharge may be of related to the finding of clue cells on wet prep. Will treat for bacterial vaginosis. Other cultures are pending.  I personally performed the services described in this documentation, which was scribed in my presence. The recorded information has been reviewed and is accurate.     Vanetta MuldersScott Gretna Bergin, MD 01/24/15 2022

## 2015-01-24 NOTE — Discharge Instructions (Signed)
Bacterial Vaginosis Bacterial vaginosis is a vaginal infection that occurs when the normal balance of bacteria in the vagina is disrupted. It results from an overgrowth of certain bacteria. This is the most common vaginal infection in women of childbearing age. Treatment is important to prevent complications, especially in pregnant women, as it can cause a premature delivery. CAUSES  Bacterial vaginosis is caused by an increase in harmful bacteria that are normally present in smaller amounts in the vagina. Several different kinds of bacteria can cause bacterial vaginosis. However, the reason that the condition develops is not fully understood. RISK FACTORS Certain activities or behaviors can put you at an increased risk of developing bacterial vaginosis, including:  Having a new sex partner or multiple sex partners.  Douching.  Using an intrauterine device (IUD) for contraception. Women do not get bacterial vaginosis from toilet seats, bedding, swimming pools, or contact with objects around them. SIGNS AND SYMPTOMS  Some women with bacterial vaginosis have no signs or symptoms. Common symptoms include:  Grey vaginal discharge.  A fishlike odor with discharge, especially after sexual intercourse.  Itching or burning of the vagina and vulva.  Burning or pain with urination. DIAGNOSIS  Your health care provider will take a medical history and examine the vagina for signs of bacterial vaginosis. A sample of vaginal fluid may be taken. Your health care provider will look at this sample under a microscope to check for bacteria and abnormal cells. A vaginal pH test may also be done.  TREATMENT  Bacterial vaginosis may be treated with antibiotic medicines. These may be given in the form of a pill or a vaginal cream. A second round of antibiotics may be prescribed if the condition comes back after treatment. Because bacterial vaginosis increases your risk for sexually transmitted diseases, getting  treated can help reduce your risk for chlamydia, gonorrhea, HIV, and herpes. HOME CARE INSTRUCTIONS   Only take over-the-counter or prescription medicines as directed by your health care provider.  If antibiotic medicine was prescribed, take it as directed. Make sure you finish it even if you start to feel better.  Tell all sexual partners that you have a vaginal infection. They should see their health care provider and be treated if they have problems, such as a mild rash or itching.  During treatment, it is important that you follow these instructions:  Avoid sexual activity or use condoms correctly.  Do not douche.  Avoid alcohol as directed by your health care provider.  Avoid breastfeeding as directed by your health care provider. SEEK MEDICAL CARE IF:   Your symptoms are not improving after 3 days of treatment.  You have increased discharge or pain.  You have a fever. MAKE SURE YOU:   Understand these instructions.  Will watch your condition.  Will get help right away if you are not doing well or get worse. FOR MORE INFORMATION  Centers for Disease Control and Prevention, Division of STD Prevention: [employer] American Sexual Health Association (ASHA): www.ashastd.org    This information is not intended to replace advice given to you by your health care provider. Make sure you discuss any questions you have with your health care provider.   Document Released: 02/03/2005 Document Revised: 02/24/2014 Document Reviewed: 09/15/2012 Elsevier Interactive Patient Education 2016 [employer]. N take antibiotic   Take antibiotic as directed. Workup did show evidence of bacterial vaginosis. Return for any new or worse symptoms. Otherwise workup was negative. As we discussed cultures are still pending and we notified  if they're abnormal.

## 2015-01-25 LAB — GC/CHLAMYDIA PROBE AMP (~~LOC~~) NOT AT ARMC
Chlamydia: NEGATIVE
Neisseria Gonorrhea: NEGATIVE

## 2015-01-26 LAB — HIV ANTIBODY (ROUTINE TESTING W REFLEX): HIV SCREEN 4TH GENERATION: NONREACTIVE

## 2015-02-12 ENCOUNTER — Emergency Department (HOSPITAL_BASED_OUTPATIENT_CLINIC_OR_DEPARTMENT_OTHER)

## 2015-02-12 ENCOUNTER — Encounter (HOSPITAL_BASED_OUTPATIENT_CLINIC_OR_DEPARTMENT_OTHER): Payer: Self-pay | Admitting: *Deleted

## 2015-02-12 ENCOUNTER — Emergency Department (HOSPITAL_BASED_OUTPATIENT_CLINIC_OR_DEPARTMENT_OTHER)
Admission: EM | Admit: 2015-02-12 | Discharge: 2015-02-12 | Disposition: A | Discharge status: Home / Self Care | Attending: Emergency Medicine | Admitting: Emergency Medicine

## 2015-02-12 DIAGNOSIS — Y9389 Activity, other specified: Secondary | ICD-10-CM | POA: Diagnosis not present

## 2015-02-12 DIAGNOSIS — J45909 Unspecified asthma, uncomplicated: Secondary | ICD-10-CM | POA: Insufficient documentation

## 2015-02-12 DIAGNOSIS — S79911A Unspecified injury of right hip, initial encounter: Secondary | ICD-10-CM | POA: Diagnosis not present

## 2015-02-12 DIAGNOSIS — S3992XA Unspecified injury of lower back, initial encounter: Secondary | ICD-10-CM | POA: Insufficient documentation

## 2015-02-12 DIAGNOSIS — F17200 Nicotine dependence, unspecified, uncomplicated: Secondary | ICD-10-CM | POA: Insufficient documentation

## 2015-02-12 DIAGNOSIS — Z8742 Personal history of other diseases of the female genital tract: Secondary | ICD-10-CM | POA: Insufficient documentation

## 2015-02-12 DIAGNOSIS — Y998 Other external cause status: Secondary | ICD-10-CM | POA: Diagnosis not present

## 2015-02-12 DIAGNOSIS — Y92410 Unspecified street and highway as the place of occurrence of the external cause: Secondary | ICD-10-CM | POA: Diagnosis not present

## 2015-02-12 LAB — PREGNANCY, URINE: PREG TEST UR: NEGATIVE

## 2015-02-12 MED ORDER — IBUPROFEN 800 MG PO TABS
800.0000 mg | ORAL_TABLET | Freq: Once | ORAL | Status: AC
  Administered 2015-02-12: 800 mg via ORAL
  Filled 2015-02-12: qty 1

## 2015-02-12 MED ORDER — IBUPROFEN 800 MG PO TABS: 800.0000 mg | ORAL_TABLET | Freq: Three times a day (TID) | ORAL | Status: DC

## 2015-02-12 MED ORDER — ACETAMINOPHEN 500 MG PO TABS: 500.0000 mg | ORAL_TABLET | Freq: Four times a day (QID) | ORAL | Status: AC | PRN

## 2015-02-12 MED ORDER — ACETAMINOPHEN 325 MG PO TABS
650.0000 mg | ORAL_TABLET | Freq: Once | ORAL | Status: AC
  Administered 2015-02-12: 650 mg via ORAL
  Filled 2015-02-12: qty 2
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
## 2015-02-12 NOTE — Discharge Instructions (Signed)
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
## 2015-02-13 ENCOUNTER — Emergency Department (HOSPITAL_BASED_OUTPATIENT_CLINIC_OR_DEPARTMENT_OTHER)
Admission: EM | Admit: 2015-02-13 | Discharge: 2015-02-13 | Discharge status: Against Medical Advice | Attending: Emergency Medicine | Admitting: Emergency Medicine

## 2015-02-13 ENCOUNTER — Encounter (HOSPITAL_BASED_OUTPATIENT_CLINIC_OR_DEPARTMENT_OTHER): Payer: Self-pay | Admitting: *Deleted

## 2015-02-13 DIAGNOSIS — Z87828 Personal history of other (healed) physical injury and trauma: Secondary | ICD-10-CM | POA: Insufficient documentation

## 2015-02-13 DIAGNOSIS — Z0289 Encounter for other administrative examinations: Secondary | ICD-10-CM | POA: Insufficient documentation

## 2015-02-13 DIAGNOSIS — J45909 Unspecified asthma, uncomplicated: Secondary | ICD-10-CM | POA: Diagnosis not present

## 2015-02-13 DIAGNOSIS — F17210 Nicotine dependence, cigarettes, uncomplicated: Secondary | ICD-10-CM | POA: Insufficient documentation
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
## 2017-12-23 ENCOUNTER — Emergency Department (HOSPITAL_COMMUNITY)
Admission: EM | Admit: 2017-12-23 | Discharge: 2017-12-23 | Disposition: A | Discharge status: Home / Self Care | Attending: Emergency Medicine | Admitting: Emergency Medicine

## 2017-12-23 ENCOUNTER — Encounter (HOSPITAL_COMMUNITY): Payer: Self-pay | Admitting: Emergency Medicine

## 2017-12-23 ENCOUNTER — Other Ambulatory Visit: Payer: Self-pay

## 2017-12-23 DIAGNOSIS — R222 Localized swelling, mass and lump, trunk: Secondary | ICD-10-CM | POA: Diagnosis present

## 2017-12-23 DIAGNOSIS — F17210 Nicotine dependence, cigarettes, uncomplicated: Secondary | ICD-10-CM | POA: Insufficient documentation

## 2017-12-23 DIAGNOSIS — J45909 Unspecified asthma, uncomplicated: Secondary | ICD-10-CM | POA: Diagnosis not present

## 2017-12-23 DIAGNOSIS — N611 Abscess of the breast and nipple: Secondary | ICD-10-CM

## 2017-12-23 MED ORDER — CLINDAMYCIN HCL 150 MG PO CAPS: 300.0000 mg | ORAL_CAPSULE | Freq: Three times a day (TID) | ORAL | 0 refills | Status: AC

## 2017-12-23 MED ORDER — BACITRACIN ZINC 500 UNIT/GM EX OINT
1.0000 "application " | TOPICAL_OINTMENT | Freq: Once | CUTANEOUS | Status: AC
  Administered 2017-12-23: 1 via TOPICAL
  Filled 2017-12-23: qty 3.6

## 2017-12-23 MED ORDER — CLINDAMYCIN HCL 150 MG PO CAPS
300.0000 mg | ORAL_CAPSULE | Freq: Once | ORAL | Status: AC
  Administered 2017-12-23: 300 mg via ORAL
  Filled 2017-12-23: qty 2

## 2017-12-23 NOTE — ED Triage Notes (Signed)
Pt reports abscess to left nipple x 1 week. Pt reports when it first appeared, she "popped" it and something drained that looked like "snot".

## 2017-12-23 NOTE — ED Notes (Signed)
Patient has been in hall multiple times.  Has a distant stare.  Patient concerned for delays, concerned about oozing from her abscess, asking for all kinds of medical supplies.  Patient is concerned "you're going to try to cut my boob off".  This RN attempts to redirect, and ask patient to return to room for patient privacy.  Patient hard to focus.

## 2017-12-23 NOTE — ED Notes (Signed)
Patient stopped another staff member, provided her with gauze for abscess.  Patient out in hall accusing this RN of not having a cotton ball and keeping them from her.  This RN explained that patient has been given multiple gauzes and patient now becoming aggravated with this RN.

## 2017-12-23 NOTE — ED Notes (Signed)
Patient in hall asking for cotton balls.  When this RN explained that we wanted the provider to see it first, patient yells at this RN "y'all are aggravating me!  All of y'all!" and walked back in to room.  Patient followed patient advocate down the hall to try to speak with him.  This RN attempted to redirect.

## 2017-12-23 NOTE — Discharge Instructions (Signed)
Take the prescribed medication as directed.  Can continue warm compresses at home. Follow-up with your primary care doctor. Return to the ED for new or worsening symptoms.

## 2017-12-23 NOTE — ED Notes (Signed)
Patient able to ambulate independently  

## 2017-12-23 NOTE — ED Provider Notes (Signed)
[facility] EMERGENCY DEPARTMENT Provider Note   CSN: [id number] Arrival date & time: 12/23/17  2053     History   Chief Complaint Chief Complaint  Patient presents with  . Abscess    HPI Donna Bowers is a 31 y.o. female.  The history is provided by the patient and medical records.     31 year old female with history of asthma, presenting to the ED with abscess of left breast.  States it is localized to her nipple.  She first noticed this about a week ago, has been using some yellow/white fluid that looks like "snot".  Reports it is very painful making it difficult for her to wear a bra.  She has been doing warm compresses at home but has not had any oral or topical medications.  She reports history of same in the past that improved on its own.  She denies any fevers or chills.  Has not had any bleeding or discharge from the nipple itself.  Past Medical History:  Diagnosis Date  . Asthma   . Ovarian cyst     There are no active problems to display for this patient.   Past Surgical History:  Procedure Laterality Date  . CESAREAN SECTION    . OVARIAN CYST SURGERY       OB History   None      Home Medications    Prior to Admission medications   Medication Sig Start Date End Date Taking? Authorizing Provider  acetaminophen (TYLENOL) 500 MG tablet Take 1 tablet (500 mg total) by mouth every 6 (six) hours as needed. 02/12/15   Cheri Fowler, PA-C  ibuprofen (ADVIL,MOTRIN) 800 MG tablet Take 1 tablet (800 mg total) by mouth 3 (three) times daily. 02/12/15   Cheri Fowler, PA-C    Family History No family history on file.  Social History Social History   Tobacco Use  . Smoking status: Current Some Day Smoker    Types: Cigarettes  . Smokeless tobacco: Never Used  Substance Use Topics  . Alcohol use: Yes    Comment: occ  . Drug use: No     Allergies   Patient has no known allergies.   Review of Systems Review of Systems  Skin:   abscess  All other systems reviewed and are negative.    Physical Exam Updated Vital Signs BP (!) 180/95 (BP Location: Right Arm)   Pulse 98   Temp 98.1 F (36.7 C) (Oral)   Resp 16   Ht 5\' 9"  (1.753 m)   Wt 122.5 kg   LMP 12/16/2017   SpO2 95%   BMI 39.87 kg/m   Physical Exam  Constitutional: She is oriented to person, place, and time. She appears well-developed and well-nourished.  HENT:  Head: Normocephalic and atraumatic.  Mouth/Throat: Oropharynx is clear and moist.  Eyes: Pupils are equal, round, and reactive to light. Conjunctivae and EOM are normal.  Neck: Normal range of motion.  Cardiovascular: Normal rate, regular rhythm and normal heart sounds.  Pulmonary/Chest: Effort normal and breath sounds normal.  Left breast with very small 0.5cm superficial abscess just adjacent to nipple that is open and draining small amounts of clear fluid; there is no surrounding induration, swelling, or cellulitic changes; no streaking of the breast; no nipple discharge or bleeding  Abdominal: Soft. Bowel sounds are normal.  Musculoskeletal: Normal range of motion.  Neurological: She is alert and oriented to person, place, and time.  Skin: Skin is warm and dry.  Psychiatric:  Very odd affect, seems somewhat paranoid  Nursing note and vitals reviewed.    ED Treatments / Results  Labs (all labs ordered are listed, but only abnormal results are displayed) Labs Reviewed - No data to display  EKG None  Radiology No results found.  Procedures Procedures (including critical care time)  Medications Ordered in ED Medications  clindamycin (CLEOCIN) capsule 300 mg (300 mg Oral Given 12/23/17 2309)  bacitracin ointment 1 application (1 application Topical Given 12/23/17 2309)     Initial Impression / Assessment and Plan / ED Course  I have reviewed the triage vital signs and the nursing notes.  Pertinent labs & imaging results that were available during my care of the patient  were reviewed by me and considered in my medical decision making (see chart for details).  32 year old female presenting to the ED with small abscess of left areola.  Has been present for a week, draining some purulent material.  She is afebrile and nontoxic.  On exam, patient has 0.5 cm abscess of left nipple that is draining small amount of clear-colored fluid.  There are no signs of cellulitis or tissue necrosis.  At this point, given this is open and draining I do not feel she needs further I&D.  Patient is appreciative and states "you ain't cutting my nipple".  Will start on oral and topical abx as she reports she needs something to "soothe" the area.  Can continue warm compresses.  Close follow-up with PCP.  Discussed plan with patient, she acknowledged understanding and agreed with plan of care.  Return precautions given for new or worsening symptoms.  Final Clinical Impressions(s) / ED Diagnoses   Final diagnoses:  Abscess of left nipple    ED Discharge Orders         Ordered    clindamycin (CLEOCIN) 150 MG capsule  3 times daily     12/23/17 2301           Garlon Hatchet, PA-C 12/23/17 2352    Raeford Razor, MD 12/24/17 1000

## 2017-12-23 NOTE — ED Notes (Signed)
Patient back in hall.  States she believes she heard her name.  This RN reassured her that we did not call her name, and told her the provider would be in to see her soon.  Patient returned willingly to her room

## 2018-03-27 ENCOUNTER — Other Ambulatory Visit: Payer: Self-pay

## 2018-03-27 ENCOUNTER — Encounter (HOSPITAL_COMMUNITY): Payer: Self-pay | Admitting: Emergency Medicine

## 2018-03-27 ENCOUNTER — Emergency Department (HOSPITAL_COMMUNITY)
Admission: EM | Admit: 2018-03-27 | Discharge: 2018-03-28 | Disposition: A | Discharge status: Home / Self Care | Attending: Emergency Medicine | Admitting: Emergency Medicine

## 2018-03-27 DIAGNOSIS — Z20828 Contact with and (suspected) exposure to other viral communicable diseases: Secondary | ICD-10-CM | POA: Insufficient documentation

## 2018-03-27 DIAGNOSIS — F17210 Nicotine dependence, cigarettes, uncomplicated: Secondary | ICD-10-CM | POA: Diagnosis not present

## 2018-03-27 DIAGNOSIS — J45909 Unspecified asthma, uncomplicated: Secondary | ICD-10-CM | POA: Diagnosis not present

## 2018-03-27 MED ORDER — OSELTAMIVIR PHOSPHATE 75 MG PO CAPS: 75.0000 mg | ORAL_CAPSULE | Freq: Every day | ORAL | 0 refills | Status: AC

## 2018-03-27 NOTE — ED Provider Notes (Signed)
[facility]-EMERGENCY DEPT Provider Note   CSN: [id number] Arrival date & time: 03/27/18  2251     History   Chief Complaint Chief Complaint  Patient presents with  . Influenza    HPI Ione Menear. Escott is a 33 y.o. female with a hx of asthma presents to the Emergency Department with her son.  Patient's son has influenza-like illness with fevers and upper respiratory symptoms.  Mother reports when son had influenza in December, she got sick as well.  She has no symptoms at this time however is requesting a prescription for Tamiflu.  The history is provided by the patient and medical records. No language interpreter was used.    Past Medical History:  Diagnosis Date  . Asthma   . Ovarian cyst     There are no active problems to display for this patient.   Past Surgical History:  Procedure Laterality Date  . CESAREAN SECTION    . OVARIAN CYST SURGERY       OB History   No obstetric history on file.      Home Medications    Prior to Admission medications   Medication Sig Start Date End Date Taking? Authorizing Provider  acetaminophen (TYLENOL) 500 MG tablet Take 1 tablet (500 mg total) by mouth every 6 (six) hours as needed. Patient not taking: Reported on 03/27/2018 02/12/15   Cheri Fowler, PA-C  clindamycin (CLEOCIN) 150 MG capsule Take 2 capsules (300 mg total) by mouth 3 (three) times daily. May dispense as 150mg  capsules Patient not taking: Reported on 03/27/2018 12/23/17   Garlon Hatchet, PA-C  ibuprofen (ADVIL,MOTRIN) 800 MG tablet Take 1 tablet (800 mg total) by mouth 3 (three) times daily. Patient not taking: Reported on 03/27/2018 02/12/15   Cheri Fowler, PA-C  oseltamivir (TAMIFLU) 75 MG capsule Take 1 capsule (75 mg total) by mouth daily for 14 days. 03/27/18 04/10/18  Ladina Shutters, Dahlia Client, PA-C    Family History No family history on file.  Social History Social History   Tobacco Use  . Smoking status: Current Some Day Smoker    Types:  Cigarettes  . Smokeless tobacco: Never Used  Substance Use Topics  . Alcohol use: Yes    Comment: occ  . Drug use: No     Allergies   Patient has no known allergies.   Review of Systems Review of Systems  Constitutional: Negative for appetite change, chills, fatigue and fever.  HENT: Negative for congestion, ear discharge, ear pain, mouth sores, postnasal drip, rhinorrhea, sinus pressure and sore throat.   Eyes: Negative for visual disturbance.  Respiratory: Negative for cough, chest tightness, shortness of breath, wheezing and stridor.   Cardiovascular: Negative for chest pain, palpitations and leg swelling.  Gastrointestinal: Negative for abdominal pain, diarrhea, nausea and vomiting.  Genitourinary: Negative for dysuria, frequency, hematuria and urgency.  Musculoskeletal: Negative for arthralgias, back pain, myalgias and neck stiffness.  Skin: Negative for rash.  Neurological: Negative for syncope, light-headedness, numbness and headaches.  Hematological: Negative for adenopathy.  Psychiatric/Behavioral: The patient is not nervous/anxious.   All other systems reviewed and are negative.    Physical Exam Updated Vital Signs BP (!) 153/113 (BP Location: Left Arm)   Pulse 74   Temp 98.5 F (36.9 C) (Oral)   Resp 16   Ht 5\' 9"  (1.753 m)   Wt 117.9 kg   SpO2 100%   BMI 38.40 kg/m   Physical Exam Vitals signs and nursing note reviewed.  Constitutional:  General: She is not in acute distress.    Appearance: She is well-developed.  HENT:     Head: Normocephalic.  Eyes:     General: No scleral icterus.    Conjunctiva/sclera: Conjunctivae normal.  Neck:     Musculoskeletal: Normal range of motion.  Cardiovascular:     Rate and Rhythm: Normal rate and regular rhythm.  Pulmonary:     Effort: Pulmonary effort is normal.     Breath sounds: Normal breath sounds.  Musculoskeletal: Normal range of motion.  Skin:    General: Skin is warm and dry.  Neurological:      Mental Status: She is alert.  Psychiatric:        Mood and Affect: Mood normal.      ED Treatments / Results   Procedures Procedures (including critical care time)  Medications Ordered in ED Medications - No data to display   Initial Impression / Assessment and Plan / ED Course  I have reviewed the triage vital signs and the nursing notes.  Pertinent labs & imaging results that were available during my care of the patient were reviewed by me and considered in my medical decision making (see chart for details).     Patient presents with her son who has influenza and is concerned that she will get this.  She is requesting a prescription for Tamiflu.  Patient without symptoms at this time, therefore no testing initiated.  Will write for Tamiflu.  Discussed potential side effects of the Tamiflu.  Patient states understanding and is in agreement with the plan.  Final Clinical Impressions(s) / ED Diagnoses   Final diagnoses:  Exposure to influenza    ED Discharge Orders         Ordered    oseltamivir (TAMIFLU) 75 MG capsule  Daily     03/27/18 2325           Nasiah Lehenbauer, Boyd Kerbs 03/27/18 2326    Tilden Fossa, MD 03/28/18 0025

## 2018-03-27 NOTE — Discharge Instructions (Addendum)
1. Medications: Tamiflu -prophylaxis since post exposure, alternate tylenol and ibuprofen for fever control, usual home medications 2. Treatment: rest, drink plenty of fluids,  3. Follow Up: Please followup with your primary doctor in 3-5 days for discussion of your diagnoses and further evaluation after today's visit; if you do not have a primary care doctor use the resource guide provided to find one; Please return to the ER for syncope, difficulty breathing, persistent high fevers, intractable vomiting or other concerns

## 2018-06-21 ENCOUNTER — Other Ambulatory Visit: Payer: Self-pay

## 2018-06-21 ENCOUNTER — Emergency Department (HOSPITAL_COMMUNITY): Payer: PRIVATE HEALTH INSURANCE

## 2018-06-21 ENCOUNTER — Emergency Department (HOSPITAL_COMMUNITY)
Admission: EM | Admit: 2018-06-21 | Discharge: 2018-06-21 | Disposition: A | Discharge status: Home / Self Care | Payer: PRIVATE HEALTH INSURANCE | Attending: Emergency Medicine | Admitting: Emergency Medicine

## 2018-06-21 ENCOUNTER — Encounter (HOSPITAL_COMMUNITY): Payer: Self-pay | Admitting: Emergency Medicine

## 2018-06-21 DIAGNOSIS — S5011XA Contusion of right forearm, initial encounter: Secondary | ICD-10-CM

## 2018-06-21 DIAGNOSIS — J45909 Unspecified asthma, uncomplicated: Secondary | ICD-10-CM | POA: Diagnosis not present

## 2018-06-21 DIAGNOSIS — F17210 Nicotine dependence, cigarettes, uncomplicated: Secondary | ICD-10-CM | POA: Diagnosis not present

## 2018-06-21 DIAGNOSIS — Y92818 Other transport vehicle as the place of occurrence of the external cause: Secondary | ICD-10-CM | POA: Diagnosis not present

## 2018-06-21 DIAGNOSIS — Y990 Civilian activity done for income or pay: Secondary | ICD-10-CM | POA: Insufficient documentation

## 2018-06-21 DIAGNOSIS — S59911A Unspecified injury of right forearm, initial encounter: Secondary | ICD-10-CM | POA: Diagnosis present

## 2018-06-21 DIAGNOSIS — W1789XA Other fall from one level to another, initial encounter: Secondary | ICD-10-CM | POA: Insufficient documentation

## 2018-06-21 DIAGNOSIS — Y9389 Activity, other specified: Secondary | ICD-10-CM | POA: Insufficient documentation

## 2018-06-21 DIAGNOSIS — N3000 Acute cystitis without hematuria: Secondary | ICD-10-CM | POA: Diagnosis not present

## 2018-06-21 DIAGNOSIS — S93402A Sprain of unspecified ligament of left ankle, initial encounter: Secondary | ICD-10-CM | POA: Diagnosis not present

## 2018-06-21 LAB — URINALYSIS, ROUTINE W REFLEX MICROSCOPIC
Bilirubin Urine: NEGATIVE
Glucose, UA: NEGATIVE mg/dL
Ketones, ur: 20 mg/dL — AB
Leukocytes,Ua: NEGATIVE
Nitrite: POSITIVE — AB
Protein, ur: 30 mg/dL — AB
Specific Gravity, Urine: 1.026 (ref 1.005–1.030)
pH: 5 (ref 5.0–8.0)

## 2018-06-21 MED ORDER — CEPHALEXIN 250 MG PO CAPS
500.0000 mg | ORAL_CAPSULE | Freq: Once | ORAL | Status: AC
  Administered 2018-06-21: 500 mg via ORAL
  Filled 2018-06-21: qty 2

## 2018-06-21 MED ORDER — IBUPROFEN 800 MG PO TABS: 800.0000 mg | ORAL_TABLET | Freq: Three times a day (TID) | ORAL | 0 refills | Status: AC

## 2018-06-21 MED ORDER — IBUPROFEN 800 MG PO TABS
800.0000 mg | ORAL_TABLET | Freq: Once | ORAL | Status: AC
  Administered 2018-06-21: 800 mg via ORAL
  Filled 2018-06-21: qty 1

## 2018-06-21 MED ORDER — CEPHALEXIN 500 MG PO CAPS: 500.0000 mg | ORAL_CAPSULE | Freq: Two times a day (BID) | ORAL | 0 refills | Status: AC

## 2018-06-21 NOTE — ED Triage Notes (Signed)
Pt works for [employer], was stepping off the truck when her left ankle twisted and the sliding door slammed her right arm. Significant swelling noted to right arm. +1 radial pulse noted to wrist.

## 2018-06-21 NOTE — ED Notes (Signed)
Pt refusing ice at this time.

## 2018-06-21 NOTE — ED Notes (Signed)
ED Provider at bedside. 

## 2018-06-21 NOTE — ED Provider Notes (Signed)
[facility]   CSN: [id number] Arrival date & time: 06/21/18  2000    History   Chief Complaint No chief complaint on file.   HPI Donna DanielQuierra C. Grace Bowers is a 33 y.o. female.     33 year old female who works for [employer] presents to the ED for complaint of left ankle pain and right arm pain.  States that she was stepping off the truck when she twisted her left ankle on the grass.  She was dropping up with packages when she subsequently lost her balance.  Her right arm got caught in the door.  Her pain is been constant, unchanged.  She has been able to weight-bear since the incident, which occurred at 1345 today, without difficulty.  No head trauma or loss of consciousness.  No medications taken prior to arrival for symptoms.  The history is provided by the patient. No language interpreter was used.    Past Medical History:  Diagnosis Date  . Asthma   . Ovarian cyst     There are no active problems to display for this patient.   Past Surgical History:  Procedure Laterality Date  . CESAREAN SECTION    . OVARIAN CYST SURGERY       OB History   No obstetric history on file.      Home Medications    Prior to Admission medications   Medication Sig Start Date End Date Taking? Authorizing Provider  acetaminophen (TYLENOL) 500 MG tablet Take 1 tablet (500 mg total) by mouth every 6 (six) hours as needed. Patient not taking: Reported on 03/27/2018 02/12/15   Cheri Fowlerose, Kayla, PA-C  cephALEXin (KEFLEX) 500 MG capsule Take 1 capsule (500 mg total) by mouth 2 (two) times daily for 5 days. 06/21/18 06/26/18  Antony MaduraHumes, Sharronda Schweers, PA-C  clindamycin (CLEOCIN) 150 MG capsule Take 2 capsules (300 mg total) by mouth 3 (three) times daily. May dispense as 150mg  capsules Patient not taking: Reported on 03/27/2018 12/23/17   Garlon HatchetSanders, Lisa M, PA-C  ibuprofen (ADVIL) 800 MG tablet Take 1 tablet (800 mg total) by mouth 3 (three) times daily. 06/21/18   Antony MaduraHumes, Yoan Sallade, PA-C     Family History History reviewed. No pertinent family history.  Social History Social History   Tobacco Use  . Smoking status: Current Some Day Smoker    Types: Cigarettes  . Smokeless tobacco: Never Used  Substance Use Topics  . Alcohol use: Yes    Comment: occ  . Drug use: No     Allergies   Patient has no known allergies.   Review of Systems Review of Systems Ten systems reviewed and are negative for acute change, except as noted in the HPI.    Physical Exam Updated Vital Signs BP (!) 154/106 (BP Location: Left Arm)   Pulse 77   Temp 97.8 F (36.6 C) (Oral)   Resp 18   Ht 5\' 9"  (1.753 m)   Wt 115.7 kg   LMP 06/21/2018   SpO2 100%   BMI 37.66 kg/m   Physical Exam Vitals signs and nursing note reviewed.  Constitutional:      General: She is not in acute distress.    Appearance: She is well-developed. She is not diaphoretic.     Comments: Nontoxic appearing and in NAD  HENT:     Head: Normocephalic and atraumatic.  Eyes:     General: No scleral icterus.    Conjunctiva/sclera: Conjunctivae normal.  Neck:     Musculoskeletal: Normal  range of motion.  Cardiovascular:     Rate and Rhythm: Normal rate and regular rhythm.     Pulses: Normal pulses.     Comments: Distal radial pulse 2+ in the RUE. DP pulse 2+ bilaterally. Pulmonary:     Effort: Pulmonary effort is normal. No respiratory distress.     Comments: Respirations even and unlabored Musculoskeletal: Normal range of motion.     Right forearm: She exhibits tenderness and swelling (soft tissue). She exhibits no deformity and no laceration.       Arms:     Comments: Compartments of all extremities are compressible. No bony deformities or crepitus noted. Contusion to dorsal, lateral R forearm with associated swelling. Minimal swelling to the L ankle.  Skin:    General: Skin is warm and dry.     Coloration: Skin is not pale.     Findings: No erythema or rash.  Neurological:     Mental Status: She  is alert and oriented to person, place, and time.     Coordination: Coordination normal.     Comments: Sensation to light touch intact. Moving all extremities spontaneously.  Psychiatric:        Behavior: Behavior normal.      ED Treatments / Results  Labs (all labs ordered are listed, but only abnormal results are displayed) Labs Reviewed  URINALYSIS, ROUTINE W REFLEX MICROSCOPIC - Abnormal; Notable for the following components:      Result Value   APPearance HAZY (*)    Hgb urine dipstick SMALL (*)    Ketones, ur 20 (*)    Protein, ur 30 (*)    Nitrite POSITIVE (*)    Bacteria, UA MANY (*)    All other components within normal limits  URINE CULTURE    EKG None  Radiology Dg Forearm Right  Result Date: 06/21/2018 CLINICAL DATA:  RIGHT arm pain and swelling, injury, caught in door at work EXAM: RIGHT FOREARM - 2 VIEW COMPARISON:  None FINDINGS: Osseous mineralization normal. RIGHT wrist and elbow joint alignments normal. No acute fracture, dislocation, or bone destruction. Dorsal soft tissue swelling of the proximal to mid forearm. IMPRESSION: No acute osseous abnormalities. Electronically Signed   By: Ulyses Southward M.D.   On: 06/21/2018 22:01   Dg Wrist Complete Right  Result Date: 06/21/2018 CLINICAL DATA:  Right arm pain status post trauma EXAM: RIGHT WRIST - COMPLETE 3+ VIEW COMPARISON:  None. FINDINGS: There is no evidence of fracture or dislocation. There is no evidence of arthropathy or other focal bone abnormality. Soft tissues are unremarkable. IMPRESSION: Negative. Electronically Signed   By: Katherine Mantle M.D.   On: 06/21/2018 22:01   Dg Ankle Complete Left  Result Date: 06/21/2018 CLINICAL DATA:  Left foot pain, caught in door EXAM: LEFT ANKLE COMPLETE - 3+ VIEW COMPARISON:  None. FINDINGS: Soft tissue swelling about the left ankle. No acute bony abnormality. Specifically, no fracture, subluxation, or dislocation. Joint spaces maintained. IMPRESSION: No acute bony  abnormality. Electronically Signed   By: Charlett Nose M.D.   On: 06/21/2018 22:01   Dg Humerus Right  Result Date: 06/21/2018 CLINICAL DATA:  RIGHT arm pain swelling and pain, injury, caught in door at work EXAM: RIGHT HUMERUS - 2+ VIEW COMPARISON:  None FINDINGS: Osseous mineralization normal. Elbow and shoulder line points grossly normal. No acute fracture, dislocation, or bone destruction. IMPRESSION: No acute osseous abnormalities. Electronically Signed   By: Ulyses Southward M.D.   On: 06/21/2018 22:01   Dg Foot  Complete Left  Result Date: 06/21/2018 CLINICAL DATA:  Left foot pain, caught in door. EXAM: LEFT FOOT - COMPLETE 3+ VIEW COMPARISON:  None. FINDINGS: There is no evidence of fracture or dislocation. There is no evidence of arthropathy or other focal bone abnormality. Soft tissues are unremarkable. IMPRESSION: Negative. Electronically Signed   By: Charlett Nose M.D.   On: 06/21/2018 22:02    Procedures Procedures (including critical care time)  Medications Ordered in ED Medications  ibuprofen (ADVIL) tablet 800 mg (has no administration in time range)  cephALEXin (KEFLEX) capsule 500 mg (has no administration in time range)     Initial Impression / Assessment and Plan / ED Course  I have reviewed the triage vital signs and the nursing notes.  Pertinent labs & imaging results that were available during my care of the patient were reviewed by me and considered in my medical decision making (see chart for details).        Patient presents to the emergency department for evaluation of L ankle pain and RUE pain 2/2 a mechanical fall PTA. Patient neurovascularly intact on exam. Imaging negative for fracture, dislocation, bony deformity. No swelling, erythema, heat to touch to the affected area; no concern for septic joint. Compartments in the affected extremity are soft, compressible. Plan for supportive management including RICE and NSAIDs; primary care follow up as needed. Patient also  incidentally found to have finding c/w UTI.  Keflex prescribed.  Return precautions discussed and provided. Patient discharged in stable condition with no unaddressed concerns.   Final Clinical Impressions(s) / ED Diagnoses   Final diagnoses:  Contusion of right forearm, initial encounter  Mild ankle sprain, left, initial encounter  Acute cystitis without hematuria    ED Discharge Orders         Ordered    ibuprofen (ADVIL) 800 MG tablet  3 times daily     06/21/18 2219    cephALEXin (KEFLEX) 500 MG capsule  2 times daily     06/21/18 2219           Antony Madura, Cordelia Poche 06/21/18 2225    Gerhard Munch, MD 06/22/18 0002

## 2018-06-21 NOTE — Discharge Instructions (Signed)
Your imaging today showed no broken bone or fracture.  We recommend that you apply ice to areas of pain and injury 3-4 times per day for 15 to 20 minutes each time.  Take ibuprofen as prescribed for management of pain.  You may wear an ankle brace for stability if desired.  As an aside, you were found to have findings consistent with a urinary tract infection.  Take Keflex as prescribed until finished.  Follow-up with your primary care doctor to ensure resolution of symptoms.

## 2018-06-21 NOTE — ED Notes (Signed)
Pt stated she is now having chest Pain. This coming after pt was mad b/c we had multiple chest pain pts that she knew about went back before her. Pt upset and talking to another pt up here about how poorly our care is here.

## 2018-06-21 NOTE — ED Notes (Signed)
Pt stepped outside for a moment

## 2020-08-24 ENCOUNTER — Other Ambulatory Visit: Payer: Self-pay

## 2020-08-24 ENCOUNTER — Encounter (HOSPITAL_BASED_OUTPATIENT_CLINIC_OR_DEPARTMENT_OTHER): Payer: Self-pay | Admitting: *Deleted

## 2020-08-24 ENCOUNTER — Emergency Department (HOSPITAL_BASED_OUTPATIENT_CLINIC_OR_DEPARTMENT_OTHER)
Admission: EM | Admit: 2020-08-24 | Discharge: 2020-08-24 | Disposition: A | Discharge status: Home / Self Care | Attending: Emergency Medicine | Admitting: Emergency Medicine

## 2020-08-24 DIAGNOSIS — F17210 Nicotine dependence, cigarettes, uncomplicated: Secondary | ICD-10-CM | POA: Insufficient documentation

## 2020-08-24 DIAGNOSIS — J45909 Unspecified asthma, uncomplicated: Secondary | ICD-10-CM | POA: Insufficient documentation

## 2020-08-24 DIAGNOSIS — Z79899 Other long term (current) drug therapy: Secondary | ICD-10-CM | POA: Diagnosis not present

## 2020-08-24 DIAGNOSIS — I10 Essential (primary) hypertension: Secondary | ICD-10-CM | POA: Insufficient documentation

## 2020-08-24 DIAGNOSIS — R110 Nausea: Secondary | ICD-10-CM | POA: Insufficient documentation

## 2020-08-24 DIAGNOSIS — K5900 Constipation, unspecified: Secondary | ICD-10-CM | POA: Insufficient documentation

## 2020-08-24 DIAGNOSIS — K625 Hemorrhage of anus and rectum: Secondary | ICD-10-CM | POA: Diagnosis not present

## 2020-08-24 DIAGNOSIS — K602 Anal fissure, unspecified: Secondary | ICD-10-CM | POA: Insufficient documentation

## 2020-08-24 HISTORY — DX: Essential (primary) hypertension: I10

## 2020-08-24 MED ORDER — DOCUSATE SODIUM 100 MG PO CAPS: 100.0000 mg | ORAL_CAPSULE | Freq: Every day | ORAL | 0 refills | Status: AC

## 2020-08-24 NOTE — ED Provider Notes (Signed)
[facility] EMERGENCY DEPARTMENT Provider Note   CSN: [id number] Arrival date & time: 08/24/20  1819     History Chief Complaint  Patient presents with   Rectal Bleeding    Donna Bowers is a 35 y.o. female.  35yo F w/ h/o constipation, HTN, asthma who p/w constipation and rectal bleeding.  Patient states that Donna Bowers has a longstanding history of intermittent abdominal pain and bloating as well as constipation.  2 to 3 years ago, Donna Bowers had some rectal bleeding and was told that it was because Donna Bowers was constipated and Donna Bowers was prescribed MiraLAX.  Donna Bowers took it for a while but eventually stopped taking it and has not taken it in a long time.  Donna Bowers reports significant constipation recently.  Donna Bowers notes that yesterday Donna Bowers had a bowel movement and did have to spend 20 to 30 minutes on the toilet straining.  When Donna Bowers wiped, Donna Bowers noted bright red blood and also some blood in the toilet.  Donna Bowers had another bowel movement today and noted blood again.  Donna Bowers denies any abdominal pain currently.  Donna Bowers occasionally has nausea but no vomiting.  No fevers, urinary symptoms, or vaginal bleeding.  [facility] but it was going to be obvious before they could see her so Donna Bowers came here.  The history is provided by the patient.  Rectal Bleeding     Past Medical History:  Diagnosis Date   Asthma    Hypertension    Ovarian cyst     There are no problems to display for this patient.   Past Surgical History:  Procedure Laterality Date   CESAREAN SECTION     OVARIAN CYST SURGERY       OB History   No obstetric history on file.     No family history on file.  Social History   Tobacco Use   Smoking status: Some Days    Pack years: 0.00    Types: Cigarettes   Smokeless tobacco: Never  Vaping Use   Vaping Use: Never used  Substance Use Topics   Alcohol use: Yes    Comment: occ   Drug use: No    Home Medications Prior to Admission medications    Medication Sig Start Date End Date Taking? Authorizing Provider  albuterol (PROVENTIL) (2.5 MG/3ML) 0.083% nebulizer solution Inhale into the lungs. 10/04/19  Yes [provider]  amLODipine (NORVASC) 10 MG tablet Take 1 tablet by mouth daily. 06/03/19  Yes [provider]  docusate sodium (COLACE) 100 MG capsule Take 1 capsule (100 mg total) by mouth daily. 08/24/20  Yes Nahiem Dredge, Ambrose Finland, MD  famotidine (PEPCID) 20 MG tablet TAKE ONE TABLET BY MOUTH DAILY - REPLACES RANITIDINE FOR HEARTBURN 06/03/19  Yes [provider]  ferrous sulfate 325 (65 FE) MG tablet Take by mouth. 12/14/19  Yes [provider]  FLUoxetine (PROZAC) 10 MG capsule Take by mouth. 04/10/20  Yes [provider]  pantoprazole (PROTONIX) 40 MG tablet TAKE ONE TABLET BY MOUTH TWICE A DAY (TAKE ON AN EMPTY STOMACH 30 MINUTES PRIOR TO A MEAL) 06/03/19  Yes [provider]  acetaminophen (TYLENOL) 500 MG tablet Take 1 tablet (500 mg total) by mouth every 6 (six) hours as needed. Patient not taking: Reported on 03/27/2018 02/12/15   Cheri Fowler, PA-C  clindamycin (CLEOCIN) 150 MG capsule Take 2 capsules (300 mg total) by mouth 3 (three) times daily. May dispense as 150mg  capsules Patient not  taking: Reported on 03/27/2018 12/23/17   Garlon Hatchet, PA-C  ibuprofen (ADVIL) 800 MG tablet Take 1 tablet (800 mg total) by mouth 3 (three) times daily. 06/21/18   Antony Madura, PA-C    Allergies    Patient has no known allergies.  Review of Systems   Review of Systems  Gastrointestinal:  Positive for hematochezia.  All other systems reviewed and are negative except that which was mentioned in HPI  Physical Exam Updated Vital Signs BP (!) 136/93 (BP Location: Right Arm)   Pulse 85   Temp 98.2 F (36.8 C) (Oral)   Resp 18   Ht 5\' 9"  (1.753 m)   Wt 113.4 kg   SpO2 100%   BMI 36.92 kg/m   Physical Exam Vitals and nursing note reviewed. Exam conducted with a chaperone present.   Constitutional:      General: Donna Bowers is not in acute distress.    Appearance: Normal appearance.  HENT:     Head: Normocephalic and atraumatic.  Eyes:     Conjunctiva/sclera: Conjunctivae normal.  Cardiovascular:     Rate and Rhythm: Normal rate and regular rhythm.     Heart sounds: Normal heart sounds. No murmur heard. Pulmonary:     Effort: Pulmonary effort is normal.     Breath sounds: Normal breath sounds.  Abdominal:     General: Abdomen is flat. Bowel sounds are normal. There is no distension.     Palpations: Abdomen is soft.     Tenderness: There is no abdominal tenderness.  Genitourinary:    Comments: No obvious external hemorrhoids but tiny anal fissure which is tender to palpation, no active bleeding Musculoskeletal:     Right lower leg: No edema.     Left lower leg: No edema.  Skin:    General: Skin is warm and dry.  Neurological:     Mental Status: Donna Bowers is alert and oriented to person, place, and time.     Comments: fluent  Psychiatric:        Mood and Affect: Mood normal.        Behavior: Behavior normal.    ED Results / Procedures / Treatments   Labs (all labs ordered are listed, but only abnormal results are displayed) Labs Reviewed - No data to display  EKG None  Radiology No results found.  Procedures Procedures   Medications Ordered in ED Medications - No data to display  ED Course  I have reviewed the triage vital signs and the nursing notes.    MDM Rules/Calculators/A&P                          No abd tenderness on exam.  Donna Bowers reports longstanding constipation history and history of straining and given her exam today, suspect rectal bleeding is from anal fissure as well as possible internal hemorrhoids.  Donna Bowers is otherwise well-appearing and has not had any bleeding outside of bowel movements therefore I feel Donna Bowers is stable for outpatient management.  Have prescribed Colace to use daily and instructed to restart taking MiraLAX with titration to  effect.  Counseled on supportive measures for fissure.  Recommended following up in the clinic as Donna Bowers will need referral to GI if symptoms do not improve with constipation treatment.  I have extensively reviewed return precautions and Donna Bowers voiced understanding. Final Clinical Impression(s) / ED Diagnoses Final diagnoses:  Rectal bleeding  Anal fissure  Constipation, unspecified constipation type    Rx /  DC Orders ED Discharge Orders          Ordered    docusate sodium (COLACE) 100 MG capsule  Daily        08/24/20 2046             Krisalyn Yankowski, Ambrose Finland, MD 08/24/20 2101

## 2020-08-24 NOTE — ED Triage Notes (Signed)
Abdominal pain x 2 days. Bright red rectal bleeding after having a hard BM. Hx of hemorrhoids.

## 2020-08-24 NOTE — Discharge Instructions (Addendum)
Start taking MiraLAX 1-3 times daily for soft stools.  Treating your constipation will help with your rectal bleeding.  Follow-up with the [facility] clinic as you will need referral to gastroenterology if your symptoms do not improve.  Return to the ER if you have severe bleeding, severe abdominal pain, or vomiting.

## 2021-01-09 IMAGING — CR RIGHT WRIST - COMPLETE 3+ VIEW
3 series · 3 of 3 positions shown · non-contrast
Comparison: None.

CLINICAL DATA: Right arm pain status post trauma

EXAM:
RIGHT WRIST - COMPLETE 3+ VIEW

[wrist pa]
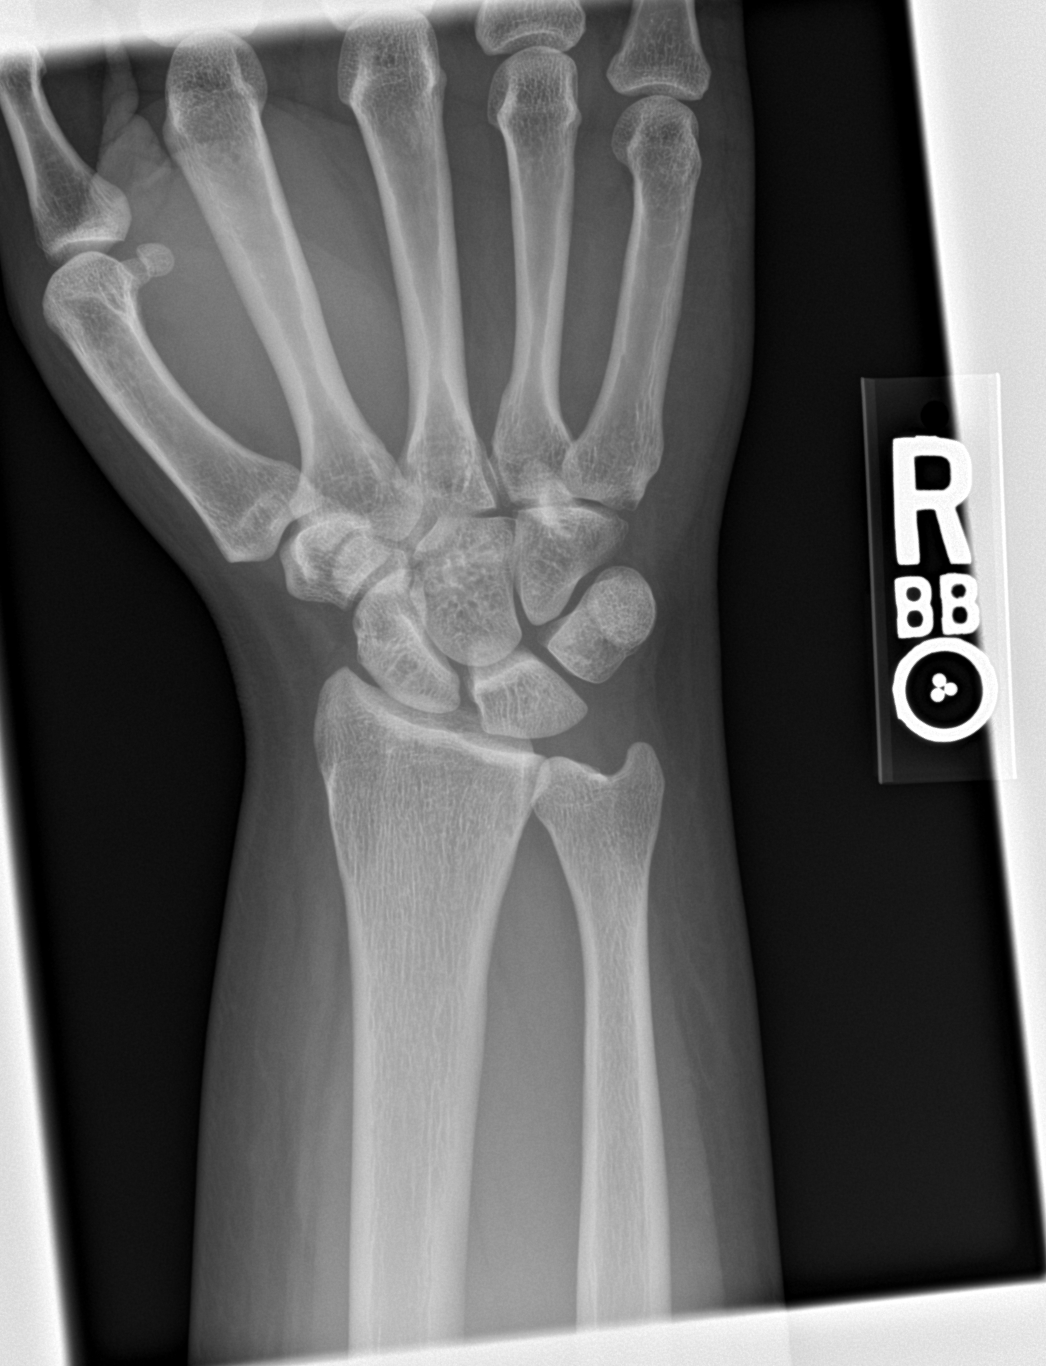

[wrist obl]
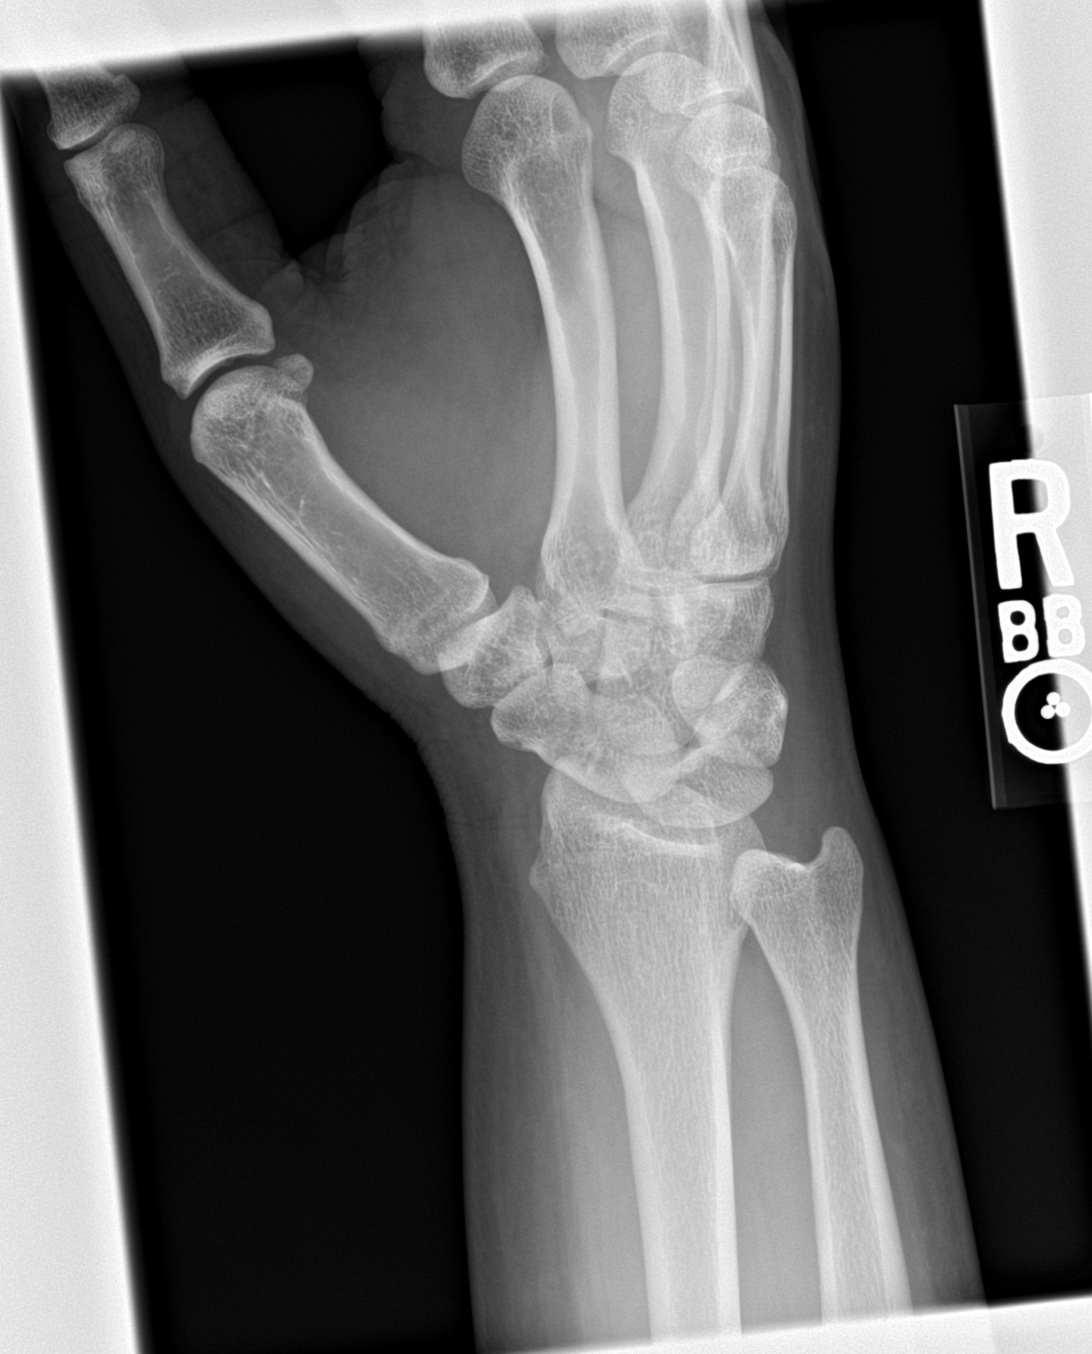

[wrist lat]
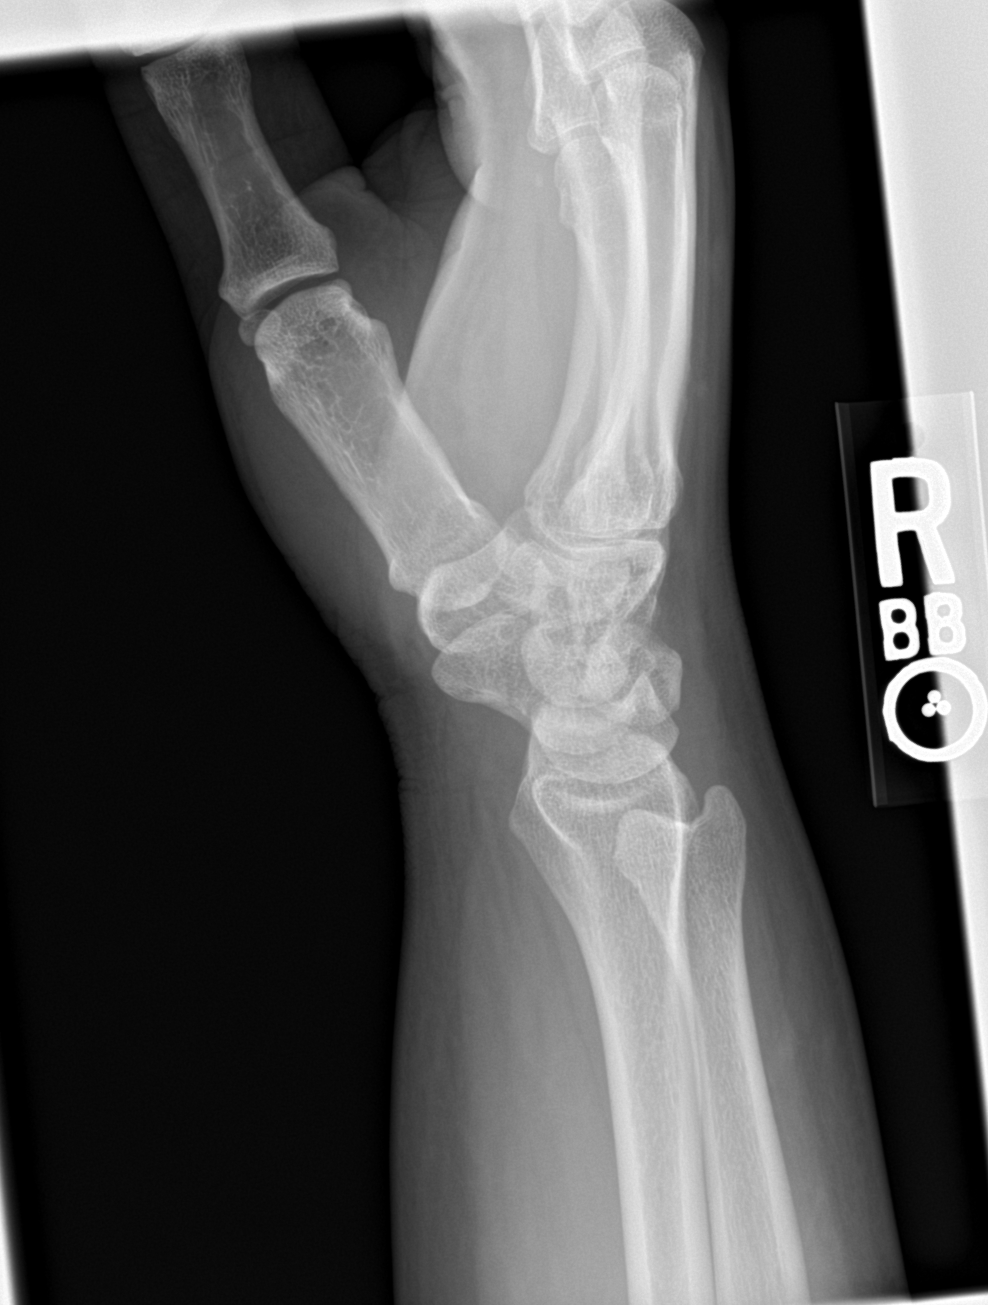

[3 of 3 positions shown; findings below may reference images not displayed]

FINDINGS: There is no evidence of fracture or dislocation. There is no
evidence of arthropathy or other focal bone abnormality. Soft
tissues are unremarkable.
IMPRESSION: Negative.

## 2021-08-22 ENCOUNTER — Emergency Department (HOSPITAL_BASED_OUTPATIENT_CLINIC_OR_DEPARTMENT_OTHER)
Admission: EM | Admit: 2021-08-22 | Discharge: 2021-08-22 | Disposition: A | Discharge status: Home / Self Care | Attending: Emergency Medicine | Admitting: Emergency Medicine

## 2021-08-22 ENCOUNTER — Encounter (HOSPITAL_BASED_OUTPATIENT_CLINIC_OR_DEPARTMENT_OTHER): Payer: Self-pay | Admitting: Emergency Medicine

## 2021-08-22 ENCOUNTER — Other Ambulatory Visit: Payer: Self-pay

## 2021-08-22 DIAGNOSIS — J45909 Unspecified asthma, uncomplicated: Secondary | ICD-10-CM | POA: Diagnosis not present

## 2021-08-22 DIAGNOSIS — I10 Essential (primary) hypertension: Secondary | ICD-10-CM | POA: Diagnosis not present

## 2021-08-22 DIAGNOSIS — R202 Paresthesia of skin: Secondary | ICD-10-CM | POA: Diagnosis present

## 2021-08-22 DIAGNOSIS — Z79899 Other long term (current) drug therapy: Secondary | ICD-10-CM | POA: Diagnosis not present

## 2021-08-22 HISTORY — DX: Depression, unspecified: F32.A

## 2021-08-22 HISTORY — DX: Schizoaffective disorder, unspecified: F25.9

## 2021-08-22 LAB — COMPREHENSIVE METABOLIC PANEL
ALT: 16 U/L (ref 0–44)
AST: 19 U/L (ref 15–41)
Albumin: 3.8 g/dL (ref 3.5–5.0)
Alkaline Phosphatase: 73 U/L (ref 38–126)
Anion gap: 6 (ref 5–15)
BUN: 9 mg/dL (ref 6–20)
CO2: 25 mmol/L (ref 22–32)
Calcium: 8.6 mg/dL — ABNORMAL LOW (ref 8.9–10.3)
Chloride: 106 mmol/L (ref 98–111)
Creatinine, Ser: 0.72 mg/dL (ref 0.44–1.00)
GFR, Estimated: >60 mL/min (ref >60)
Glucose, Bld: 92 mg/dL (ref 70–99)
Potassium: 3.6 mmol/L (ref 3.5–5.1)
Sodium: 137 mmol/L (ref 135–145)
Total Bilirubin: 0.6 mg/dL (ref 0.3–1.2)
Total Protein: 7.6 g/dL (ref 6.5–8.1)

## 2021-08-22 LAB — CBC
HCT: 36.7 % (ref 36.0–46.0)
Hemoglobin: 11.7 g/dL — ABNORMAL LOW (ref 12.0–15.0)
MCH: 25.8 pg — ABNORMAL LOW (ref 26.0–34.0)
MCHC: 31.9 g/dL (ref 30.0–36.0)
MCV: 81 fL (ref 80.0–100.0)
Platelets: 321 10*3/uL (ref 150–400)
RBC: 4.53 MIL/uL (ref 3.87–5.11)
RDW: 15.4 % (ref 11.5–15.5)
WBC: 5.3 10*3/uL (ref 4.0–10.5)
nRBC: 0 % (ref 0.0–0.2)

## 2021-08-22 MED ORDER — CALCIUM CARBONATE ANTACID 500 MG PO CHEW: 1.0000 | CHEWABLE_TABLET | Freq: Two times a day (BID) | ORAL | 0 refills | Status: AC

## 2021-08-22 NOTE — ED Triage Notes (Addendum)
States she woke up at 0600 with left side of her face feeling weird and left foot as well. Denies pain, gait steady. Was eval by PCP and started on Norvasc

## 2021-08-22 NOTE — ED Provider Notes (Signed)
[facility] EMERGENCY DEPARTMENT Provider Note   CSN: [id number] Arrival date & time: 08/22/21  0731     History  Chief Complaint  Patient presents with   Tingling    Donna Bowers is a 36 y.o. female.  HPI Patient presents feeling bad.  States she was feeling a little off yesterday.  Got seen at PCP because she was not having her menses.  States they had a hard time finding her cervix.  States her blood pressure was also elevated and they had her take her blood pressure medicine.  This morning she states she woke up and had twitching on the left side of her face.  States her left foot was also moving a little bit.  Was not numb.  States it feels a little strange.  Still able to ambulate.  Feeling somewhat better now but feels just a little strange.  No headache.  No confusion.  States the face just feels a little different inside her cheek.  Patient took her blood pressure medicine last night.   Past Medical History:  Diagnosis Date   Asthma    Depression    Hypertension    Ovarian cyst    Schizoaffective disorder (HCC)     Home Medications Prior to Admission medications   Medication Sig Start Date End Date Taking? Authorizing Provider  calcium carbonate (TUMS) 500 MG chewable tablet Chew 1 tablet (200 mg of elemental calcium total) by mouth 2 (two) times daily. 08/22/21  Yes Benjiman Core, MD  acetaminophen (TYLENOL) 500 MG tablet Take 1 tablet (500 mg total) by mouth every 6 (six) hours as needed. Patient not taking: Reported on 03/27/2018 02/12/15   Cheri Fowler, PA-C  albuterol (PROVENTIL) (2.5 MG/3ML) 0.083% nebulizer solution Inhale into the lungs. 10/04/19   [provider]  amLODipine (NORVASC) 10 MG tablet Take 1 tablet by mouth daily. 06/03/19   [provider]  clindamycin (CLEOCIN) 150 MG capsule Take 2 capsules (300 mg total) by mouth 3 (three) times daily. May dispense as 150mg  capsules Patient not taking: Reported on 03/27/2018 12/23/17    13/6/19, PA-C  docusate sodium (COLACE) 100 MG capsule Take 1 capsule (100 mg total) by mouth daily. 08/24/20   Little, 10/25/20, MD  famotidine (PEPCID) 20 MG tablet TAKE ONE TABLET BY MOUTH DAILY - REPLACES RANITIDINE FOR HEARTBURN 06/03/19   [provider]  ferrous sulfate 325 (65 FE) MG tablet Take by mouth. 12/14/19   [provider]  FLUoxetine (PROZAC) 10 MG capsule Take by mouth. 04/10/20   [provider]  ibuprofen (ADVIL) 800 MG tablet Take 1 tablet (800 mg total) by mouth 3 (three) times daily. 06/21/18   08/21/18, PA-C  pantoprazole (PROTONIX) 40 MG tablet TAKE ONE TABLET BY MOUTH TWICE A DAY (TAKE ON AN EMPTY STOMACH 30 MINUTES PRIOR TO A MEAL) 06/03/19   [provider]      Allergies    Patient has no known allergies.    Review of Systems   Review of Systems  Physical Exam Updated Vital Signs BP (!) 137/92   Pulse 65   Temp 98 F (36.7 C) (Oral)   Resp 18   Ht 5\' 10"  (1.778 m)   Wt 124.1 kg   SpO2 98%   BMI 39.24 kg/m  Physical Exam Vitals and nursing note reviewed.  HENT:     Head: Atraumatic.  Eyes:     Pupils: Pupils are equal, round, and reactive to  light.  Pulmonary:     Breath sounds: No wheezing.  Abdominal:     Tenderness: There is no abdominal tenderness.  Musculoskeletal:        General: No tenderness.  Skin:    General: Skin is warm.  Neurological:     Mental Status: She is alert and oriented to person, place, and time.     Comments: Sensation and strength intact bilateral lower extremities.  Good flexion extension of the foot.  Sensation intact in the foot.  Good grip strength bilaterally.  Good movement of lower extremities.  Sensation intact bilaterally on face.  Equal smile.  Eye movements intact.  Psychiatric:     Comments: Mildly flat affect.     ED Results / Procedures / Treatments   Labs (all labs ordered are listed, but only abnormal results are displayed) Labs Reviewed   COMPREHENSIVE METABOLIC PANEL - Abnormal; Notable for the following components:      Result Value   Calcium 8.6 (*)    All other components within normal limits  CBC - Abnormal; Notable for the following components:   Hemoglobin 11.7 (*)    MCH 25.8 (*)    All other components within normal limits    EKG EKG Interpretation  Date/Time:  Thursday August 22 2021 07:46:43 EDT Ventricular Rate:  67 PR Interval:  153 QRS Duration: 85 QT Interval:  385 QTC Calculation: 407 R Axis:   42 Text Interpretation: Sinus rhythm nonspecific repol changes. No old tracing to compare Confirmed by Benjiman Core [phone]) on 08/22/2021 8:07:17 AM  Radiology No results found.  Procedures Procedures    Medications Ordered in ED Medications - No data to display  ED Course/ Medical Decision Making/ A&P                           Medical Decision Making Amount and/or Complexity of Data Reviewed Labs: ordered.  Risk OTC drugs.   Patient with complaint of strange facial sensation and reported twitching in the foot.  Both resolved now.  No headache.  Confusion..  Benign exam.  Differential diagnosis includes but is not limited to stroke, seizure, hypercalcemia, hypocalcemia.  Will get some basic lab work and reevaluate.  Lab work reassuring.  Has mildly low calcium however.  Potentially could do some of the symptoms.  Benign exam now.  Doubt cause such as stroke or focal seizures.  May also have some component of her underlying schizoaffective disorder.  Appears stable for discharge.  Will supplement orally with outpatient follow-up.  Discharge home.        Final Clinical Impression(s) / ED Diagnoses Final diagnoses:  Hypocalcemia    Rx / DC Orders ED Discharge Orders          Ordered    calcium carbonate (TUMS) 500 MG chewable tablet  2 times daily        08/22/21 0935              Benjiman Core, MD 08/22/21 [id number]

## 2021-08-22 NOTE — Discharge Instructions (Addendum)
Your calcium level is a little low.  Calcium supplementation such as Tums may help over the next few days.  Follow-up with your doctor as needed.

## 2022-08-25 ENCOUNTER — Ambulatory Visit: Admitting: Dietician

## 2022-09-18 ENCOUNTER — Ambulatory Visit: Admitting: Dietician
